# Patient Record
Sex: Male | Born: 1935 | Race: White | Hispanic: No | Marital: Married | Smoking: Current every day smoker
Health system: Southern US, Community
[De-identification: ages and names within clinical notes are randomized; demographics above are authoritative.]

## PROBLEM LIST (undated history)

## (undated) DIAGNOSIS — R339 Retention of urine, unspecified: Secondary | ICD-10-CM

## (undated) DIAGNOSIS — L97929 Non-pressure chronic ulcer of unspecified part of left lower leg with unspecified severity: Secondary | ICD-10-CM

## (undated) DIAGNOSIS — I359 Nonrheumatic aortic valve disorder, unspecified: Secondary | ICD-10-CM

## (undated) DIAGNOSIS — C9110 Chronic lymphocytic leukemia of B-cell type not having achieved remission: Secondary | ICD-10-CM

## (undated) DIAGNOSIS — F17210 Nicotine dependence, cigarettes, uncomplicated: Secondary | ICD-10-CM

## (undated) DIAGNOSIS — I517 Cardiomegaly: Secondary | ICD-10-CM

## (undated) DIAGNOSIS — N400 Enlarged prostate without lower urinary tract symptoms: Secondary | ICD-10-CM

## (undated) DIAGNOSIS — I5030 Unspecified diastolic (congestive) heart failure: Secondary | ICD-10-CM

## (undated) DIAGNOSIS — J449 Chronic obstructive pulmonary disease, unspecified: Secondary | ICD-10-CM

## (undated) DIAGNOSIS — R161 Splenomegaly, not elsewhere classified: Secondary | ICD-10-CM

## (undated) DIAGNOSIS — I2510 Atherosclerotic heart disease of native coronary artery without angina pectoris: Secondary | ICD-10-CM

## (undated) DIAGNOSIS — I700 Atherosclerosis of aorta: Secondary | ICD-10-CM

## (undated) DIAGNOSIS — I739 Peripheral vascular disease, unspecified: Secondary | ICD-10-CM

## (undated) DIAGNOSIS — I4510 Unspecified right bundle-branch block: Secondary | ICD-10-CM

## (undated) DIAGNOSIS — D649 Anemia, unspecified: Secondary | ICD-10-CM

## (undated) HISTORY — DX: Unspecified right bundle-branch block: I45.10

## (undated) HISTORY — DX: Anemia, unspecified: D64.9

## (undated) HISTORY — DX: Unspecified diastolic (congestive) heart failure: I50.30

## (undated) HISTORY — DX: Nonrheumatic aortic valve disorder, unspecified: I35.9

## (undated) HISTORY — DX: Cardiomegaly: I51.7

## (undated) HISTORY — PX: HERNIA REPAIR: SHX51

## (undated) HISTORY — DX: Nicotine dependence, cigarettes, uncomplicated: F17.210

## (undated) HISTORY — DX: Chronic obstructive pulmonary disease, unspecified: J44.9

## (undated) HISTORY — PX: KNEE SURGERY: SHX244

## (undated) HISTORY — DX: Peripheral vascular disease, unspecified: I73.9

## (undated) HISTORY — DX: Non-pressure chronic ulcer of unspecified part of left lower leg with unspecified severity: L97.929

---

## 2020-03-09 ENCOUNTER — Emergency Department
Admission: EM | Admit: 2020-03-09 | Discharge: 2020-03-09 | Disposition: A | Discharge status: Home / Self Care | Payer: Medicare HMO | Attending: Emergency Medicine | Admitting: Emergency Medicine

## 2020-03-09 ENCOUNTER — Other Ambulatory Visit: Payer: Self-pay

## 2020-03-09 ENCOUNTER — Encounter: Payer: Self-pay | Admitting: Emergency Medicine

## 2020-03-09 DIAGNOSIS — F17200 Nicotine dependence, unspecified, uncomplicated: Secondary | ICD-10-CM | POA: Diagnosis not present

## 2020-03-09 DIAGNOSIS — N179 Acute kidney failure, unspecified: Secondary | ICD-10-CM | POA: Insufficient documentation

## 2020-03-09 DIAGNOSIS — N19 Unspecified kidney failure: Secondary | ICD-10-CM

## 2020-03-09 DIAGNOSIS — R338 Other retention of urine: Secondary | ICD-10-CM

## 2020-03-09 DIAGNOSIS — R1033 Periumbilical pain: Secondary | ICD-10-CM | POA: Insufficient documentation

## 2020-03-09 DIAGNOSIS — R339 Retention of urine, unspecified: Secondary | ICD-10-CM | POA: Insufficient documentation

## 2020-03-09 DIAGNOSIS — R102 Pelvic and perineal pain: Secondary | ICD-10-CM

## 2020-03-09 HISTORY — DX: Benign prostatic hyperplasia without lower urinary tract symptoms: N40.0

## 2020-03-09 HISTORY — DX: Retention of urine, unspecified: R33.9

## 2020-03-09 LAB — COMPREHENSIVE METABOLIC PANEL
ALT: 16 U/L (ref 0–44)
AST: 26 U/L (ref 15–41)
Albumin: 4.4 g/dL (ref 3.5–5.0)
Alkaline Phosphatase: 79 U/L (ref 38–126)
Anion gap: 10 (ref 5–15)
BUN: 31 mg/dL — ABNORMAL HIGH (ref 8–23)
CO2: 23 mmol/L (ref 22–32)
Calcium: 9.1 mg/dL (ref 8.9–10.3)
Chloride: 100 mmol/L (ref 98–111)
Creatinine, Ser: 1.93 mg/dL — ABNORMAL HIGH (ref 0.61–1.24)
GFR calc Af Amer: 35 mL/min — ABNORMAL LOW (ref >60)
GFR calc non Af Amer: 30 mL/min — ABNORMAL LOW (ref >60)
Glucose, Bld: 99 mg/dL (ref 70–99)
Potassium: 4.9 mmol/L (ref 3.5–5.1)
Sodium: 133 mmol/L — ABNORMAL LOW (ref 135–145)
Total Bilirubin: 0.7 mg/dL (ref 0.3–1.2)
Total Protein: 7 g/dL (ref 6.5–8.1)

## 2020-03-09 LAB — URINALYSIS, COMPLETE (UACMP) WITH MICROSCOPIC
Bacteria, UA: NONE SEEN
Bilirubin Urine: NEGATIVE
Glucose, UA: NEGATIVE mg/dL
Hgb urine dipstick: NEGATIVE
Ketones, ur: NEGATIVE mg/dL
Nitrite: NEGATIVE
Protein, ur: NEGATIVE mg/dL
Specific Gravity, Urine: 1.008 (ref 1.005–1.030)
Squamous Epithelial / HPF: NONE SEEN (ref 0–5)
pH: 6 (ref 5.0–8.0)

## 2020-03-09 LAB — CBC WITH DIFFERENTIAL/PLATELET
Abs Immature Granulocytes: 0.03 10*3/uL (ref 0.00–0.07)
Basophils Absolute: 0 10*3/uL (ref 0.0–0.1)
Basophils Relative: 0 %
Eosinophils Absolute: 0 10*3/uL (ref 0.0–0.5)
Eosinophils Relative: 0 %
HCT: 36 % — ABNORMAL LOW (ref 39.0–52.0)
Hemoglobin: 12.6 g/dL — ABNORMAL LOW (ref 13.0–17.0)
Immature Granulocytes: 0 %
Lymphocytes Relative: 39 %
Lymphs Abs: 4.4 10*3/uL — ABNORMAL HIGH (ref 0.7–4.0)
MCH: 29.4 pg (ref 26.0–34.0)
MCHC: 35 g/dL (ref 30.0–36.0)
MCV: 83.9 fL (ref 80.0–100.0)
Monocytes Absolute: 1.3 10*3/uL — ABNORMAL HIGH (ref 0.1–1.0)
Monocytes Relative: 11 %
Neutro Abs: 5.5 10*3/uL (ref 1.7–7.7)
Neutrophils Relative %: 50 %
Platelets: 121 10*3/uL — ABNORMAL LOW (ref 150–400)
RBC: 4.29 MIL/uL (ref 4.22–5.81)
RDW: 14.5 % (ref 11.5–15.5)
WBC: 11.3 10*3/uL — ABNORMAL HIGH (ref 4.0–10.5)
nRBC: 0 % (ref 0.0–0.2)

## 2020-03-09 NOTE — ED Notes (Signed)
Leg bag placed by EDT. Patient is alert and oriented. Family member at bedside states he understands use and instructions on how to empty the bag as he has had many in the past. Discharge instructions given and verbalized understanding.

## 2020-03-09 NOTE — Discharge Instructions (Addendum)
You were seen in the ED because of your urinary retention.  We placed an indwelling Foley catheter to relieve this pressure.  Please call Dr. Carlton Adam office to be seen by one of our urologists in the next 1 to 2 weeks.  They will likely remove the Foley catheter. Please continue to take your Bactrim antibiotic, tamsulosin and finasteride.  Finish the whole antibiotic course.  I would also recommend that you be seen by a new primary care physician within the next 1-2 weeks.  Please call [facility] to establish with them. They would likely need to repeat an EKG because yours wasslightly abnormal here.  They will also need to repeat some blood tests related to your kidneys to make sure your numbers improved.  Your creatinine was slightly elevated here today, likely just because of your urinary retention.  It would be good to ensure that this improves.   If you develop any chest pain, heart palpitations, shortness of breath or passing out, please return to the ED. If you develop any fevers, nondraining Foley catheter or worsening abdominal pain, please return to the ED.

## 2020-03-09 NOTE — ED Triage Notes (Signed)
Pt to ED via POV c/o urinary retention. Pt states that this is recurrent issue. Pt was able to go around 0100 but none since then. Pt states that he feels like he needs to pee but is unable to. Pt is in NAD  Pt noted to have irregular HR during triage. EKG obtained and reviewed by Dr. Corky Downs.

## 2020-03-09 NOTE — ED Notes (Signed)
Bladder scan indicates pt is holding over 925mL. Levada Dy RN notified.

## 2020-03-09 NOTE — ED Provider Notes (Signed)
[facility] Emergency Department Provider Note ____________________________________________   First MD Initiated Contact with Patient 03/09/20 1509     (approximate)  I have reviewed the triage vital signs and the nursing notes.  HISTORY  Chief Complaint Urinary Retention   HPI Spencer Buck is a 86 y.o. malewho presents to the ED for evaluation of urinary retention.   Chart review indicates patient has never been to our facility.  Patient presents with his daughter to the ED for evaluation of acute urinary retention. They report he recently moved to the area from [place].  There, he has a urologist and a history of recurrent urinary retention.  He reports having a catheter indwelling in the past multiple times.  He reports currently being on Bactrim for 14 days to treat UTI, as well as being prescribed finasteride and tamsulosin.   They report that during the recent move to the area, patient lost his finasteride and tamsulosin and went multiple days without his medications.  He does report having his Bactrim through all this and has been compliant with his medication.  He reports improving dysuria.  Denies fevers, flank pain, back pain.   He reports onset of inability to urinate for the past 13 hours despite multiple efforts.  He reports increasing discomfort to his lower abdomen.  He reports relief of his pain with indwelling Foley catheter placement prior to my evaluation of the patient.  Pain was up to 10/10 intensity, lower abdomen, aching in nature, nonradiating and he did not take any medications prior to arrival.  Patient has no cardiac history.  Denies ever having heart attack, denies stents in his heart and denies ever seeing a cardiologist.  He denies any recent chest pain, palpitations, shortness of breath, syncope, diaphoresis.   Past Medical History:  Diagnosis Date  . BPH (benign prostatic hyperplasia)   . Urinary retention      There are no problems to display for this patient.   Past Surgical History:  Procedure Laterality Date  . HERNIA REPAIR    . KNEE SURGERY Left     Prior to Admission medications   Not on File    Allergies Patient has no known allergies.  No family history on file.  Social History Social History   Tobacco Use  . Smoking status: Current Every Day Smoker  . Smokeless tobacco: Never Used  Substance Use Topics  . Alcohol use: Yes    Comment: 1/2 beer every night   . Drug use: Never    Review of Systems  Constitutional: No fever/chills Eyes: No visual changes. ENT: No sore throat. Cardiovascular: Denies chest pain. Respiratory: Denies shortness of breath. Gastrointestinal: No abdominal pain.  No nausea, no vomiting.  No diarrhea.  No constipation. Genitourinary: Negative for dysuria.  Positive for acute urinary retention. Musculoskeletal: Negative for back pain. Skin: Negative for rash. Neurological: Negative for headaches, focal weakness or numbness.   ____________________________________________   PHYSICAL EXAM:  VITAL SIGNS: Vitals:   03/09/20 1359 03/09/20 1514  BP: (!) 141/71   Pulse: (!) 25 90  Resp: 16   SpO2: 98% 99%      Constitutional: Alert and oriented. Well appearing and in no acute distress.  Slightly hard of hearing.  Pleasant and conversational full sentences. Eyes: Conjunctivae are normal. PERRL. EOMI. Head: Atraumatic. Nose: No congestion/rhinnorhea. Mouth/Throat: Mucous membranes are moist.  Oropharynx non-erythematous. Neck: No stridor. No cervical spine tenderness to palpation. Cardiovascular: Normal rate, regular rhythm. Grossly normal heart sounds.  Good peripheral circulation. Respiratory: Normal respiratory effort.  No retractions. Lungs CTAB. Gastrointestinal: Soft , nondistended, nontender to palpation. No abdominal bruits. No CVA tenderness. Indwelling Foley catheter in place draining yellow urine. Benign abdomen  throughout. Musculoskeletal: No lower extremity tenderness nor edema.  No joint effusions. No signs of acute trauma. Neurologic:  Normal speech and language. No gross focal neurologic deficits are appreciated. No gait instability noted. Skin:  Skin is warm, dry and intact. No rash noted. Psychiatric: Mood and affect are normal. Speech and behavior are normal.  ____________________________________________   LABS (all labs ordered are listed, but only abnormal results are displayed)  Labs Reviewed  URINALYSIS, COMPLETE (UACMP) WITH MICROSCOPIC - Abnormal; Notable for the following components:      Result Value   Color, Urine YELLOW (*)    APPearance HAZY (*)    Leukocytes,Ua MODERATE (*)    All other components within normal limits  CBC WITH DIFFERENTIAL/PLATELET - Abnormal; Notable for the following components:   WBC 11.3 (*)    Hemoglobin 12.6 (*)    HCT 36.0 (*)    Platelets 121 (*)    Lymphs Abs 4.4 (*)    Monocytes Absolute 1.3 (*)    All other components within normal limits  COMPREHENSIVE METABOLIC PANEL - Abnormal; Notable for the following components:   Sodium 133 (*)    BUN 31 (*)    Creatinine, Ser 1.93 (*)    GFR calc non Af Amer 30 (*)    GFR calc Af Amer 35 (*)    All other components within normal limits  URINE CULTURE   ____________________________________________  12 Lead EKG  Sinus rhythm with sinus arrhythmia.  Rate of 85 bpm.  Normal axis.  Evidence of incomplete RBBB, but intervals otherwise normal.  T wave inversions anteriorly in V1-V3.  No ST elevations or evidence of acute ischemia.  No comparison in our system. _________________________   PROCEDURES and INTERVENTIONS  Foley catheter placement by RN ____________________________________________   MDM / ED COURSE  86 year old man with history of BPH and urinary retention presents to the ED with acute retention requiring indwelling Foley catheter placement, and amenable to outpatient management.   Normal vital signs on room air.  Exam after Foley catheter placement is reassuring without evidence of acute pathology.  His abdomen is benign and he is draining yellow urine appropriately.  He otherwise looks well and has no evidence of pathology.  Blood work, without previous comparison, shows mild prerenal AKI likely due to his acute urinary retention and associated hydronephrosis, that is now cleared.  Urine with moderate leukocytes, but patient is currently on Bactrim for UTI.  He has no evidence of pyelonephritis or failure of treatment.  Urine sent for culture and will provide information to follow-up with urology within our system, since patient is new to the area.  Further provided information for follow-up with new PCP to ensure his renal function improves and to get repeat EKG.  His EKG has no evidence of ischemia or ACS, and he has no symptoms of such, but does have a sinus arrhythmia with T wave inversions anteriorly without a comparison.  Without his symptoms and without clear pathology on his EKG, no indications for further cardiac testing in the ED.  We discussed return precautions for the ED with daughter and patient.  We discussed outpatient management.  Patient medically stable for discharge home.  ____________________________________________   FINAL CLINICAL IMPRESSION(S) / ED DIAGNOSES  Final diagnoses:  Acute urinary retention  Acute prerenal azotemia  Suprapubic abdominal pain  AKI (acute kidney injury) [facility])     ED Discharge Orders    None       Spencer Buck   Note:  This document was prepared using Dragon voice recognition software and may include unintentional dictation errors.   Vladimir Crofts, MD 03/09/20 [phone]

## 2020-03-13 LAB — URINE CULTURE: Culture: >=100000 — AB

## 2020-03-14 NOTE — Progress Notes (Signed)
ED Antimicrobial Stewardship Positive Culture Follow Up   Spencer Buck is an 86 y.o. male who presented to [facility] on 03/09/2020 with a chief complaint of urinary retention. This pharmacist spoke to patient and he reports no UTI symptoms. Per patient, he continues to have the foley catheter in place and is almost done with his course of Bactrime. D/w Dr. Tamala Julian and it was noted that the patient does not need to start linezolid in the setting of no symptoms. Per patient's daughter, he has an appointment with a Urologist on October 8th.   Chief Complaint  Patient presents with  . Urinary Retention    Recent Results (from the past 720 hour(s))  Urine culture     Status: Abnormal   Collection Time: 03/09/20  2:33 PM   Specimen: Urine, Random  Result Value Ref Range Status   Specimen Description   Final    URINE, RANDOM Performed at [facility], [address], [place], [place] [address]    Special Requests   Final    NONE Performed at [facility], [address] [place],  [address]    Culture (A)  Final    >=100,000 COLONIES/mL VANCOMYCIN RESISTANT ENTEROCOCCUS ISOLATED   Report Status 03/13/2020 FINAL  Final   Organism ID, Bacteria VANCOMYCIN RESISTANT ENTEROCOCCUS ISOLATED (A)  Final      Susceptibility   Vancomycin resistant enterococcus isolated - MIC*    AMPICILLIN >=32 RESISTANT Resistant     NITROFURANTOIN 128 RESISTANT Resistant     VANCOMYCIN >=32 RESISTANT Resistant     LINEZOLID 2 SENSITIVE Sensitive     * >=100,000 COLONIES/mL VANCOMYCIN RESISTANT ENTEROCOCCUS ISOLATED    [x]  Treated with Bactrim, organism resistant to/is not covered by prescribed antimicrobial []  Patient discharged originally without antimicrobial agent and treatment is now indicated  New antibiotic prescription: None   ED Provider: Dr. Vladimir Crofts   Rowland Lathe ,[place] Pharmacist  03/14/2020, 6:13 PM

## 2020-03-14 NOTE — Progress Notes (Signed)
Pharmacy tried calling patient's daughter but was unable to reach her. Voicemail is full and unable to leave a VM.   Spencer Buck, PharmD Clinical Pharmacist

## 2020-03-21 NOTE — Progress Notes (Signed)
° °  03/22/2020 12:27 PM   Spencer Buck [birth year] [id number]  Referring provider: No referring provider defined for this encounter. No chief complaint on file.   HPI: Spencer Buck is a 86 y.o. male who presents today for evaluation and management of urinary retention.   -Patient recently moved to the area from [place].    -January 2021 began to have bothersome lower urinary tract symptoms and recurrent UTIs -Has been seen by urology at [facility] -Apparently 2-3 episodes of urinary retention since January 2021 -seen in the ED at [facility] on 03/09/2020 for urinary retention. He reported being on Bactrim for 14 days to treat UTI, as well as being prescribed finasteride and tamsulosin. During the move to [place] the patient reportedly lost his finasteride and tamsulosin and went multiple days without his medications.  -Foley catheter was placed at that visit -Urine culture grew greater than 100,000 colonies vancomycin-resistant Enterococcus however he was having no UTI symptoms -Presently no complaints and tolerating catheter well  PMH: Past Medical History:  Diagnosis Date   BPH (benign prostatic hyperplasia)    Urinary retention     Surgical History: Past Surgical History:  Procedure Laterality Date   HERNIA REPAIR     KNEE SURGERY Left     Home Medications:  Allergies as of 03/22/2020   No Known Allergies     Medication List    as of March 21, 2020 12:27 PM   You have not been prescribed any medications.     Allergies: No Known Allergies  Family History: No family history on file.  Social History:  reports that he has been smoking. He has never used smokeless tobacco. He reports current alcohol use. He reports that he does not use drugs.   Physical Exam: There were no vitals taken for this visit.  Constitutional:  Alert and oriented, No acute distress. HEENT: [place] AT, moist mucus membranes.  Trachea midline, no masses. Cardiovascular: No  clubbing, cyanosis, or edema. Respiratory: Normal respiratory effort, no increased work of breathing. GI: Abdomen is soft, nontender, nondistended, no abdominal masses GU: Prostate with increased firmness; volume 80+ cc Skin: No rashes, bruises or suspicious lesions. Neurologic: Grossly intact, no focal deficits, moving all 4 extremities. Psychiatric: Normal mood and affect.  Laboratory Data:  Lab Results  Component Value Date   CREATININE 1.93 (H) 03/09/2020    Assessment & Plan:    1.  BPH with LUTS  Has restarted tamsulosin/finasteride  Record request for his prior urologic records  2.  Recurrent urinary retention  We will leave his catheter indwelling over the weekend.  He was instructed on how to remove the catheter and will do so Monday morning and return here late Monday morning/early afternoon for bladder scan for PVR  3.  Recurrent UTI  Presently asymptomatic and most recent Enterococcus culture may represent colonization  Repeat UA/culture when he returns next week  [facility] [address] [place], [place] [address] [phone]  I, Selena Batten, am acting as a scribe for Dr. Nicki Reaper C. Nayib Remer,  I have reviewed the above documentation for accuracy and completeness, and I agree with the above.   Abbie Sons, MD

## 2020-03-22 ENCOUNTER — Other Ambulatory Visit: Payer: Self-pay

## 2020-03-22 ENCOUNTER — Encounter: Payer: Self-pay | Admitting: Urology

## 2020-03-22 ENCOUNTER — Ambulatory Visit: Payer: Self-pay | Admitting: Physician Assistant

## 2020-03-22 ENCOUNTER — Ambulatory Visit: Payer: Medicare PPO | Admitting: Urology

## 2020-03-22 VITALS — BP 100/64 | HR 76 | Ht 72.0 in | Wt 153.6 lb

## 2020-03-22 DIAGNOSIS — R339 Retention of urine, unspecified: Secondary | ICD-10-CM

## 2020-03-22 DIAGNOSIS — N401 Enlarged prostate with lower urinary tract symptoms: Secondary | ICD-10-CM | POA: Diagnosis not present

## 2020-03-22 DIAGNOSIS — N390 Urinary tract infection, site not specified: Secondary | ICD-10-CM

## 2020-03-25 ENCOUNTER — Other Ambulatory Visit: Payer: Self-pay

## 2020-03-25 ENCOUNTER — Ambulatory Visit (INDEPENDENT_AMBULATORY_CARE_PROVIDER_SITE_OTHER): Payer: Medicare PPO | Admitting: Physician Assistant

## 2020-03-25 VITALS — BP 96/56 | HR 73

## 2020-03-25 DIAGNOSIS — R339 Retention of urine, unspecified: Secondary | ICD-10-CM

## 2020-03-25 LAB — BLADDER SCAN AMB NON-IMAGING

## 2020-03-26 LAB — URINALYSIS, COMPLETE
Bilirubin, UA: NEGATIVE
Glucose, UA: NEGATIVE
Ketones, UA: NEGATIVE
Leukocytes,UA: NEGATIVE
Nitrite, UA: NEGATIVE
Protein,UA: NEGATIVE
Specific Gravity, UA: 1.015 (ref 1.005–1.030)
Urobilinogen, Ur: 0.2 mg/dL (ref 0.2–1.0)
pH, UA: 6 (ref 5.0–7.5)

## 2020-03-26 LAB — MICROSCOPIC EXAMINATION: Bacteria, UA: NONE SEEN

## 2020-03-28 NOTE — Progress Notes (Signed)
03/25/2020 5:20 PM   Spencer Buck 04-03-1931 [id number]  CC: Chief Complaint  Patient presents with  . Urinary Retention    PM bladder scan    HPI: Spencer Buck is a 86 y.o. male with a history of BPH with LUTS on tamsulosin and finasteride, recurrent UTI versus possible VRE colonization, and urinary retention who presents today for afternoon PVR after at home catheter removal this morning.  Patient developed urinary retention recently in the setting of holding his prosthetic agents.  These have been restarted.  Today he reports feeling well.  No acute concerns.  He has been urinating.  In-office UA today positive for trace-intact blood; urine microscopy with 6-10 WBCs/HPF. PVR 25mL.  PMH: Past Medical History:  Diagnosis Date  . BPH (benign prostatic hyperplasia)   . Urinary retention     Surgical History: Past Surgical History:  Procedure Laterality Date  . HERNIA REPAIR    . KNEE SURGERY Left     Home Medications:  Allergies as of 03/25/2020   No Known Allergies     Medication List       Accurate as of March 25, 2020 11:59 PM. If you have any questions, ask your nurse or doctor.        finasteride 5 MG tablet Commonly known as: PROSCAR Take 5 mg by mouth daily.   sulfamethoxazole-trimethoprim 800-160 MG tablet Commonly known as: BACTRIM DS Take 1 tablet by mouth 2 (two) times daily.   tamsulosin 0.4 MG Caps capsule Commonly known as: FLOMAX Take 0.8 mg by mouth daily.       Allergies:  No Known Allergies  Family History: No family history on file.  Social History:   reports that he has been smoking. He has never used smokeless tobacco. He reports current alcohol use. He reports that he does not use drugs.  Physical Exam: BP (!) 96/56   Pulse 73   Constitutional:  Alert and oriented, no acute distress, nontoxic appearing HEENT: [place], AT Cardiovascular: No clubbing, cyanosis, or edema Respiratory: Normal respiratory effort, no  increased work of breathing Skin: No rashes, bruises or suspicious lesions Neurologic: Grossly intact, no focal deficits, moving all 4 extremities Psychiatric: Normal mood and affect  Laboratory Data: Results for orders placed or performed in visit on 03/25/20  CULTURE, URINE COMPREHENSIVE   Specimen: Urine   UR  Result Value Ref Range   Urine Culture, Comprehensive Preliminary report (A)    Organism ID, Bacteria Enterococcus faecium (A)    Organism ID, Bacteria Comment   Microscopic Examination   Urine  Result Value Ref Range   WBC, UA 6-10 (A) 0 - 5 /hpf   RBC 0-2 0 - 2 /hpf   Epithelial Cells (non renal) 0-10 0 - 10 /hpf   Casts Present (A) None seen /lpf   Cast Type Hyaline casts N/A   Bacteria, UA None seen None seen/Few  Urinalysis, Complete  Result Value Ref Range   Specific Gravity, UA 1.015 1.005 - 1.030   pH, UA 6.0 5.0 - 7.5   Color, UA Yellow Yellow   Appearance Ur Clear Clear   Leukocytes,UA Negative Negative   Protein,UA Negative Negative/Trace   Glucose, UA Negative Negative   Ketones, UA Negative Negative   RBC, UA Trace (A) Negative   Bilirubin, UA Negative Negative   Urobilinogen, Ur 0.2 0.2 - 1.0 mg/dL   Nitrite, UA Negative Negative   Microscopic Examination See below:   BLADDER SCAN AMB NON-IMAGING  Result Value Ref  Range   Scan Result 54ml    Assessment & Plan:   1. Urinary retention PVR WNL today, successful voiding trial after having restarted tamsulosin and finasteride.  UA obtained today, will send for culture for further evaluation. - Urinalysis, Complete - CULTURE, URINE COMPREHENSIVE - BLADDER SCAN AMB NON-IMAGING  Return in about 6 months (around 09/23/2020) for Symptom recheck with PVR, h/o retention.  Debroah Loop, PA-C  [facility] [address] [place], [place] [address] [phone]

## 2020-03-31 LAB — CULTURE, URINE COMPREHENSIVE

## 2020-08-01 ENCOUNTER — Other Ambulatory Visit: Payer: Self-pay

## 2020-08-01 ENCOUNTER — Encounter: Payer: Medicare PPO | Attending: Physician Assistant | Admitting: Physician Assistant

## 2020-08-01 DIAGNOSIS — I83891 Varicose veins of right lower extremities with other complications: Secondary | ICD-10-CM | POA: Insufficient documentation

## 2020-08-01 DIAGNOSIS — I872 Venous insufficiency (chronic) (peripheral): Secondary | ICD-10-CM | POA: Insufficient documentation

## 2020-08-01 DIAGNOSIS — I878 Other specified disorders of veins: Secondary | ICD-10-CM | POA: Insufficient documentation

## 2020-08-01 DIAGNOSIS — F17200 Nicotine dependence, unspecified, uncomplicated: Secondary | ICD-10-CM | POA: Diagnosis not present

## 2020-08-01 NOTE — Progress Notes (Addendum)
ROYALTY, DOMAGALA ([id number]) Visit Report for 08/01/2020 Allergy List Details Patient Name: Spencer Buck, Spencer Buck Date of Service: 08/01/2020 8:30 AM Medical Record Number: [id number] Patient Account Number: [id number] Date of Birth/Sex: 1931/01/18 (87 y.o. M) Treating RN: Carlene Coria Primary Care Treson Laura: Eda Paschal Other Clinician: Referring Yvonnie Schinke: Eda Paschal Treating Taralynn Quiett/Extender: Skipper Cliche in Treatment: 0 Allergies Active Allergies No Known Allergies Type: Allergen Allergy Notes Electronic Signature(s) Signed: 08/01/2020 9:40:15 AM By: Carlene Coria RN Entered By: Carlene Coria on 08/01/2020 09:07:28 Spencer Buck ([id number]) -------------------------------------------------------------------------------- Arrival Information Details Patient Name: Spencer Buck Date of Service: 08/01/2020 8:30 AM Medical Record Number: [id number] Patient Account Number: [id number] Date of Birth/Sex: January 14, 1931 (87 y.o. M) Treating RN: Carlene Coria Primary Care Aslan Himes: Eda Paschal Other Clinician: Referring Coulson Wehner: Eda Paschal Treating Kalim Kissel/Extender: Skipper Cliche in Treatment: 0 Visit Information Patient Arrived: Ambulatory Arrival Time: 08:57 Accompanied By: daughter Transfer Assistance: None Patient Identification Verified: Yes Secondary Verification Process Completed: Yes Patient Requires Transmission-Based Precautions: No Patient Has Alerts: No Electronic Signature(s) Signed: 08/01/2020 9:40:15 AM By: Carlene Coria RN Entered By: Carlene Coria on 08/01/2020 09:00:25 Spencer Buck ([id number]) -------------------------------------------------------------------------------- Clinic Level of Care Assessment Details Patient Name: Spencer Buck Date of Service: 08/01/2020 8:30 AM Medical Record Number: [id number] Patient Account Number: [id number] Date of Birth/Sex: 1930/07/28 (87 y.o. M) Treating RN: Dolan Amen Primary Care Satish Hammers:  Eda Paschal Other Clinician: Referring Idania Desouza: Eda Paschal Treating Kysa Calais/Extender: Skipper Cliche in Treatment: 0 Clinic Level of Care Assessment Items TOOL 4 Quantity Score X - Use when only an EandM is performed on FOLLOW-UP visit 1 0 ASSESSMENTS - Nursing Assessment / Reassessment X - Reassessment of Co-morbidities (includes updates in patient status) 1 10 X- 1 5 Reassessment of Adherence to Treatment Plan ASSESSMENTS - Wound and Skin Assessment / Reassessment []  - Simple Wound Assessment / Reassessment - one wound 0 []  - 0 Complex Wound Assessment / Reassessment - multiple wounds []  - 0 Dermatologic / Skin Assessment (not related to wound area) ASSESSMENTS - Focused Assessment X - Circumferential Edema Measurements - multi extremities 1 5 []  - 0 Nutritional Assessment / Counseling / Intervention X- 1 5 Lower Extremity Assessment (monofilament, tuning fork, pulses) []  - 0 Peripheral Arterial Disease Assessment (using hand held doppler) ASSESSMENTS - Ostomy and/or Continence Assessment and Care []  - Incontinence Assessment and Management 0 []  - 0 Ostomy Care Assessment and Management (repouching, etc.) PROCESS - Coordination of Care X - Simple Patient / Family Education for ongoing care 1 15 []  - 0 Complex (extensive) Patient / Family Education for ongoing care []  - 0 Staff obtains Programmer, systems, Records, Test Results / Process Orders []  - 0 Staff telephones HHA, Nursing Homes / Clarify orders / etc []  - 0 Routine Transfer to another Facility (non-emergent condition) []  - 0 Routine Hospital Admission (non-emergent condition) []  - 0 New Admissions / Biomedical engineer / Ordering NPWT, Apligraf, etc. []  - 0 Emergency Hospital Admission (emergent condition) X- 1 10 Simple Discharge Coordination []  - 0 Complex (extensive) Discharge Coordination PROCESS - Special Needs []  - Pediatric / Minor Patient Management 0 []  - 0 Isolation Patient  Management []  - 0 Hearing / Language / Visual special needs []  - 0 Assessment of Community assistance (transportation, D/C planning, etc.) []  - 0 Additional assistance / Altered mentation []  - 0 Support Surface(s) Assessment (bed, cushion, seat, etc.) INTERVENTIONS - Wound Cleansing / Measurement Richens, Omarii ([id number]) []  - 0 Simple Wound Cleansing - one wound []  - 0 Complex Wound Cleansing - multiple  wounds []  - 0 Wound Imaging (photographs - any number of wounds) []  - 0 Wound Tracing (instead of photographs) []  - 0 Simple Wound Measurement - one wound []  - 0 Complex Wound Measurement - multiple wounds INTERVENTIONS - Wound Dressings []  - Small Wound Dressing one or multiple wounds 0 []  - 0 Medium Wound Dressing one or multiple wounds []  - 0 Large Wound Dressing one or multiple wounds []  - 0 Application of Medications - topical []  - 0 Application of Medications - injection INTERVENTIONS - Miscellaneous []  - External ear exam 0 []  - 0 Specimen Collection (cultures, biopsies, blood, body fluids, etc.) []  - 0 Specimen(s) / Culture(s) sent or taken to Lab for analysis []  - 0 Patient Transfer (multiple staff / Civil Service fast streamer / Similar devices) []  - 0 Simple Staple / Suture removal (25 or less) []  - 0 Complex Staple / Suture removal (26 or more) []  - 0 Hypo / Hyperglycemic Management (close monitor of Blood Glucose) []  - 0 Ankle / Brachial Index (ABI) - do not check if billed separately X- 1 5 Vital Signs Has the patient been seen at the hospital within the last three years: Yes Total Score: 55 Level Of Care: New/Established - Level 2 Electronic Signature(s) Signed: 08/01/2020 12:41:55 PM By: Georges Mouse, Minus Breeding RN Entered By: Georges Mouse, Minus Breeding on 08/01/2020 09:28:26 Spencer Buck ([id number]) -------------------------------------------------------------------------------- Encounter Discharge Information Details Patient Name: Spencer Buck Date  of Service: 08/01/2020 8:30 AM Medical Record Number: [id number] Patient Account Number: [id number] Date of Birth/Sex: [birth year] (87 y.o. M) Treating RN: Dolan Amen Primary Care Teron Blais: Eda Paschal Other Clinician: Referring Abhay Godbolt: Eda Paschal Treating Audrick Lamoureaux/Extender: Skipper Cliche in Treatment: 0 Encounter Discharge Information Items Discharge Condition: Stable Ambulatory Status: Ambulatory Discharge Destination: Home Transportation: Private Auto Accompanied By: daughter Schedule Follow-up Appointment: No Clinical Summary of Care: Electronic Signature(s) Signed: 08/01/2020 12:41:55 PM By: Georges Mouse, Minus Breeding RN Entered By: Georges Mouse, Minus Breeding on 08/01/2020 09:38:02 Spencer Buck ([id number]) -------------------------------------------------------------------------------- Lower Extremity Assessment Details Patient Name: Spencer Buck Date of Service: 08/01/2020 8:30 AM Medical Record Number: [id number] Patient Account Number: [id number] Date of Birth/Sex: 1931/01/29 (87 y.o. M) Treating RN: Carlene Coria Primary Care Laquia Rosano: Eda Paschal Other Clinician: Referring Darcy Barbara: Eda Paschal Treating Lynk Marti/Extender: Skipper Cliche in Treatment: 0 Edema Assessment Assessed: [Left: No] [Right: No] Edema: [Left: Yes] [Right: Yes] Calf Left: Right: Point of Measurement: 43 cm From Medial Instep 36 cm 38 cm Ankle Left: Right: Point of Measurement: 11 cm From Medial Instep 24.5 cm 24 cm Knee To Floor Left: Right: From Medial Instep 52 cm 53 cm Vascular Assessment Pulses: Dorsalis Pedis Palpable: [Left:Yes] [Right:Yes] Electronic Signature(s) Signed: 08/01/2020 9:40:15 AM By: Carlene Coria RN Signed: 08/01/2020 12:41:55 PM By: Georges Mouse, Minus Breeding RN Entered By: Georges Mouse, Minus Breeding on 08/01/2020 09:34:21 Spencer Buck ([id number]) -------------------------------------------------------------------------------- Multi Wound Chart  Details Patient Name: Spencer Buck Date of Service: 08/01/2020 8:30 AM Medical Record Number: [id number] Patient Account Number: [id number] Date of Birth/Sex: 1931/03/15 (87 y.o. M) Treating RN: Dolan Amen Primary Care Talibah Colasurdo: Eda Paschal Other Clinician: Referring Tyriana Helmkamp: Eda Paschal Treating Keaisha Sublette/Extender: Skipper Cliche in Treatment: 0 Vital Signs Height(in): 72 Pulse(bpm): 92 Weight(lbs): 154 Blood Pressure(mmHg): 115/65 Body Mass Index(BMI): 21 Temperature(F): 97.9 Respiratory Rate(breaths/min): 18 Wound Assessments Treatment Notes Electronic Signature(s) Signed: 08/01/2020 12:41:55 PM By: Georges Mouse, Minus Breeding RN Entered By: Georges Mouse, Minus Breeding on 08/01/2020 09:24:28 Spencer Buck ([id number]) -------------------------------------------------------------------------------- Multi-Disciplinary Care Plan Details Patient Name: Spencer Buck Date of Service: 08/01/2020 8:30 AM Medical Record Number:  [id number] Patient Account Number: [id number] Date of Birth/Sex: November 16, 1930 (87 y.o. M) Treating RN: Dolan Amen Primary Care Toretto Tingler: Eda Paschal Other Clinician: Referring Zamarian Scarano: Eda Paschal Treating Aerin Delany/Extender: Skipper Cliche in Treatment: 0 Active Inactive Electronic Signature(s) Signed: 08/07/2020 4:26:01 PM By: Charlett Nose RN Previous Signature: 08/01/2020 12:41:55 PM Version By: Georges Mouse, Minus Breeding RN Entered By: Georges Mouse, Minus Breeding on 08/07/2020 16:26:01 Spencer Buck ([id number]) -------------------------------------------------------------------------------- Pain Assessment Details Patient Name: Spencer Buck Date of Service: 08/01/2020 8:30 AM Medical Record Number: [id number] Patient Account Number: [id number] Date of Birth/Sex: [birth year] (87 y.o. M) Treating RN: Carlene Coria Primary Care Jerney Baksh: Eda Paschal Other Clinician: Referring Joel Cowin: Eda Paschal Treating  Kendricks Reap/Extender: Skipper Cliche in Treatment: 0 Active Problems Location of Pain Severity and Description of Pain Patient Has Paino No Site Locations Pain Management and Medication Current Pain Management: Electronic Signature(s) Signed: 08/01/2020 9:40:15 AM By: Carlene Coria RN Entered By: Carlene Coria on 08/01/2020 09:00:53 Spencer Buck ([id number]) -------------------------------------------------------------------------------- Patient/Caregiver Education Details Patient Name: Spencer Buck Date of Service: 08/01/2020 8:30 AM Medical Record Number: [id number] Patient Account Number: [id number] Date of Birth/Gender: 07/23/1930 (87 y.o. M) Treating RN: Dolan Amen Primary Care Physician: Eda Paschal Other Clinician: Referring Physician: Eda Paschal Treating Physician/Extender: Skipper Cliche in Treatment: 0 Education Assessment Education Provided To: Patient Education Topics Provided Notes Educated on leg elevation, wearing compression socks and following up with vascular Electronic Signature(s) Signed: 08/01/2020 12:41:55 PM By: Georges Mouse, Minus Breeding RN Entered By: Georges Mouse, Minus Breeding on 08/01/2020 09:29:01 Spencer Buck ([id number]) -------------------------------------------------------------------------------- Vitals Details Patient Name: Spencer Buck Date of Service: 08/01/2020 8:30 AM Medical Record Number: [id number] Patient Account Number: [id number] Date of Birth/Sex: [birth year] (87 y.o. M) Treating RN: Carlene Coria Primary Care Keiran Sias: Eda Paschal Other Clinician: Referring Jaslin Novitski: Eda Paschal Treating Anurag Scarfo/Extender: Skipper Cliche in Treatment: 0 Vital Signs Time Taken: 09:00 Temperature (F): 97.9 Height (in): 72 Pulse (bpm): 92 Source: Stated Respiratory Rate (breaths/min): 18 Weight (lbs): 154 Blood Pressure (mmHg): 115/65 Source: Stated Reference Range: 80 - 120 mg / dl Body Mass Index (BMI):  20.9 Electronic Signature(s) Signed: 08/01/2020 9:40:15 AM By: Carlene Coria RN Entered By: Carlene Coria on 08/01/2020 09:01:23

## 2020-08-01 NOTE — Progress Notes (Signed)
Spencer, Buck ([id number]) Visit Report for 08/01/2020 Chief Complaint Document Details Patient Name: Spencer Buck, Spencer Buck Date of Service: 08/01/2020 8:30 AM Medical Record Number: [id number] Patient Account Number: [id number] Date of Birth/Sex: 1931/03/14 (87 y.o. M) Treating RN: Dolan Amen Primary Care Provider: Eda Paschal Other Clinician: Referring Provider: Eda Paschal Treating Provider/Extender: Skipper Cliche in Treatment: 0 Information Obtained from: Patient Chief Complaint Bilateral LE Venous Stasis Electronic Signature(s) Signed: 08/01/2020 9:21:30 AM By: Worthy Keeler PA-C Entered By: Worthy Keeler on 08/01/2020 09:21:30 Spencer Buck ([id number]) -------------------------------------------------------------------------------- HPI Details Patient Name: Spencer Buck Date of Service: 08/01/2020 8:30 AM Medical Record Number: [id number] Patient Account Number: [id number] Date of Birth/Sex: 1930-06-22 (87 y.o. M) Treating RN: Dolan Amen Primary Care Provider: Eda Paschal Other Clinician: Referring Provider: Eda Paschal Treating Provider/Extender: Skipper Cliche in Treatment: 0 History of Present Illness HPI Description: 08/01/2020 upon evaluation today patient presents for initial inspection here in the clinic concerning issues he has been having with his lower extremities. He did have some open blisters/wounds on the lower extremities but fortunately this seems to be doing significantly better. There does not appear to be any signs of infection and in fact he is doing quite well when it comes to his legs currently. From a wound perspective. With that being said he does have varicose veins as well as edema noted at this point. Fortunately there is no signs of active infection at this time. He did have a test at his primary care provider's office this was a TM flow report and it showed that he did have noncompressible readings there.  Nonetheless he has not been referred to vascular yet I do think he needs to be for venous evaluation and likely reflux study. Hopefully they can help more with managing his varicose veins I do think he needs compression for that I discussed getting that done today. Electronic Signature(s) Signed: 08/01/2020 9:42:07 AM By: Worthy Keeler PA-C Entered By: Worthy Keeler on 08/01/2020 09:42:07 ELAND, LAMANTIA ([id number]) -------------------------------------------------------------------------------- Physical Exam Details Patient Name: Spencer Buck Date of Service: 08/01/2020 8:30 AM Medical Record Number: [id number] Patient Account Number: [id number] Date of Birth/Sex: 1930/10/27 (87 y.o. M) Treating RN: Dolan Amen Primary Care Provider: Eda Paschal Other Clinician: Referring Provider: Eda Paschal Treating Provider/Extender: Skipper Cliche in Treatment: 0 Constitutional sitting or standing blood pressure is within target range for patient.. pulse regular and within target range for patient.Marland Kitchen respirations regular, non- labored and within target range for patient.Marland Kitchen temperature within target range for patient.. Well-nourished and well-hydrated in no acute distress. Eyes conjunctiva clear no eyelid edema noted. pupils equal round and reactive to light and accommodation. Ears, Nose, Mouth, and Throat no gross abnormality of ear auricles or external auditory canals. normal hearing noted during conversation. mucus membranes moist. Respiratory normal breathing without difficulty. Cardiovascular 2+ dorsalis pedis/posterior tibialis pulses. no clubbing, cyanosis, significant edema, <3 sec cap refill. Musculoskeletal normal gait and posture. no significant deformity or arthritic changes, no loss or range of motion, no clubbing. Psychiatric this patient is able to make decisions and demonstrates good insight into disease process. Alert and Oriented x 3. pleasant and  cooperative. Notes Upon inspection patient's wound bed actually showed signs again of complete epithelization there is no open wounds at this time which is great news and overall very pleased with where things stand. Fortunately there is no sign of active infection and overall I think that he is doing quite well. His blood pressure is also up compared at  the primary care provider's office likely due to the fact that they have been making him drink more water especially and he is much better hydrated according to what his daughter tells me. Overall I think that he is doing quite well in that regard. Electronic Signature(s) Signed: 08/01/2020 9:42:42 AM By: Worthy Keeler PA-C Entered By: Worthy Keeler on 08/01/2020 09:42:42 Spencer Buck ([id number]) -------------------------------------------------------------------------------- Physician Orders Details Patient Name: Spencer Buck Date of Service: 08/01/2020 8:30 AM Medical Record Number: [id number] Patient Account Number: [id number] Date of Birth/Sex: 1931-03-19 (87 y.o. M) Treating RN: Dolan Amen Primary Care Provider: Eda Paschal Other Clinician: Referring Provider: Eda Paschal Treating Provider/Extender: Skipper Cliche in Treatment: 0 Verbal / Phone Orders: No Diagnosis Coding ICD-10 Coding Code Description I87.2 Venous insufficiency (chronic) (peripheral) J44.9 Chronic obstructive pulmonary disease, unspecified Discharge From [place] Only - no open areas, referred to vascular o Wear compression garments daily. Put garments on first thing when you wake up and remove them before bed. o Moisturize legs daily after removing compression garments. o Elevate, Exercise Daily and Avoid Standing for Long Periods of Time. Consults o Vascular - Varicose veins Patient Medications Allergies: No Known Allergies Notifications Medication Indication Start End triamcinolone acetonide  08/01/2020 DOSE 0 - topical 0.1 % ointment - ointment topical applied in a thin film mixed half and half with Eucerin at bedtime daily x 30 days Electronic Signature(s) Signed: 08/01/2020 9:45:24 AM By: Worthy Keeler PA-C Entered By: Worthy Keeler on 08/01/2020 09:45:24 Spencer Buck ([id number]) -------------------------------------------------------------------------------- Problem List Details Patient Name: Spencer Buck Date of Service: 08/01/2020 8:30 AM Medical Record Number: [id number] Patient Account Number: [id number] Date of Birth/Sex: 17-Oct-1930 (87 y.o. 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 10/30/6158 No Yes I83.892 Varicose veins of left lower extremity with other complications 7/37/1062 No Yes J44.9 Chronic obstructive pulmonary disease, unspecified 08/01/2020 No Yes Inactive Problems Resolved Problems Electronic Signature(s) Signed: 08/01/2020 9:41:06 AM By: Worthy Keeler PA-C Previous Signature: 08/01/2020 9:21:12 AM Version By: Worthy Keeler PA-C Entered By: Worthy Keeler on 08/01/2020 09:41:06 Spencer Buck ([id number]) -------------------------------------------------------------------------------- Progress Note Details Patient Name: Spencer Buck Date of Service: 08/01/2020 8:30 AM Medical Record Number: [id number] Patient Account Number: [id number] Date of Birth/Sex: February 13, 1931 (87 y.o. M) Treating RN: Dolan Amen Primary Care Provider: Eda Paschal Other Clinician: Referring Provider: Eda Paschal Treating Provider/Extender: Skipper Cliche in Treatment: 0 Subjective Chief  Complaint Information obtained from Patient Bilateral LE Venous Stasis History of Present Illness (HPI) 08/01/2020 upon evaluation today patient presents for initial inspection here in the clinic concerning issues he has been having with his lower extremities. He did have some open blisters/wounds on the lower extremities but fortunately this seems to be doing significantly better. There does not appear to be any signs of infection and in fact he is doing quite well when it comes to his legs currently. From a wound perspective. With that being said he does have varicose veins as well as edema noted at this point. Fortunately there is no signs of active infection at this time. He did have a test at his primary care provider's office this was a TM flow report and it showed that he did have noncompressible readings  there. Nonetheless he has not been referred to vascular yet I do think he needs to be for venous evaluation and likely reflux study. Hopefully they can help more with managing his varicose veins I do think he needs compression for that I discussed getting that done today. Patient History Information obtained from Patient. Allergies No Known Allergies Social History Current every day smoker, Marital Status - Married, Alcohol Use - Moderate, Drug Use - No History, Caffeine Use - Daily. Medical History Eyes Denies history of Cataracts, Glaucoma, Optic Neuritis Ear/Nose/Mouth/Throat Denies history of Chronic sinus problems/congestion, Middle ear problems Hematologic/Lymphatic Denies history of Anemia, Hemophilia, Human Immunodeficiency Virus, Lymphedema, Sickle Cell Disease Respiratory Patient has history of Chronic Obstructive Pulmonary Disease (COPD) Denies history of Aspiration, Asthma, Pneumothorax, Sleep Apnea, Tuberculosis Cardiovascular Denies history of Angina, Arrhythmia, Congestive Heart Failure, Coronary Artery Disease, Deep Vein Thrombosis, Hypertension,  Hypotension, Myocardial Infarction, Peripheral Arterial Disease, Peripheral Venous Disease, Phlebitis, Vasculitis Gastrointestinal Denies history of Cirrhosis , Colitis, Crohn s, Hepatitis A, Hepatitis B, Hepatitis C Endocrine Denies history of Type I Diabetes, Type II Diabetes Genitourinary Denies history of End Stage Renal Disease Immunological Denies history of Lupus Erythematosus, Raynaud s, Scleroderma Integumentary (Skin) Denies history of History of Burn, History of pressure wounds Musculoskeletal Denies history of Gout, Rheumatoid Arthritis, Osteoarthritis, Osteomyelitis Neurologic Denies history of Dementia, Neuropathy, Quadriplegia, Paraplegia, Seizure Disorder Oncologic Denies history of Received Chemotherapy, Received Radiation Psychiatric Denies history of Anorexia/bulimia, Confinement Anxiety Review of Systems (ROS) Constitutional Symptoms (General Health) Denies complaints or symptoms of Fatigue, Fever, Chills, Marked Weight Change. Eyes Complains or has symptoms of Vision Changes. Denies complaints or symptoms of Dry Eyes, Glasses / Contacts. Ear/Nose/Mouth/Throat Denies complaints or symptoms of Difficult clearing ears, Sinusitis. Hematologic/Lymphatic PISTOL, KESSENICH ([id number]) Denies complaints or symptoms of Bleeding / Clotting Disorders, Human Immunodeficiency Virus. Respiratory Denies complaints or symptoms of Chronic or frequent coughs, Shortness of Breath. Cardiovascular Denies complaints or symptoms of Chest pain, LE edema. Gastrointestinal Denies complaints or symptoms of Frequent diarrhea, Nausea, Vomiting. Endocrine Denies complaints or symptoms of Hepatitis, Thyroid disease, Polydypsia (Excessive Thirst). Genitourinary Denies complaints or symptoms of Kidney failure/ Dialysis, Incontinence/dribbling. Immunological Denies complaints or symptoms of Hives, Itching. Integumentary (Skin) Denies complaints or symptoms of Wounds, Bleeding or  bruising tendency, Breakdown, Swelling. Musculoskeletal Denies complaints or symptoms of Muscle Pain, Muscle Weakness. Neurologic Denies complaints or symptoms of Numbness/parasthesias, Focal/Weakness. Psychiatric Denies complaints or symptoms of Anxiety, Claustrophobia. Objective Constitutional sitting or standing blood pressure is within target range for patient.. pulse regular and within target range for patient.Marland Kitchen respirations regular, non- labored and within target range for patient.Marland Kitchen temperature within target range for patient.. Well-nourished and well-hydrated in no acute distress. Vitals Time Taken: 9:00 AM, Height: 72 in, Source: Stated, Weight: 154 lbs, Source: Stated, BMI: 20.9, Temperature: 97.9 F, Pulse: 92 bpm, Respiratory Rate: 18 breaths/min, Blood Pressure: 115/65 mmHg. Eyes conjunctiva clear no eyelid edema noted. pupils equal round and reactive to light and accommodation. Ears, Nose, Mouth, and Throat no gross abnormality of ear auricles or external auditory canals. normal hearing noted during conversation. mucus membranes moist. Respiratory normal breathing without difficulty. Cardiovascular 2+ dorsalis pedis/posterior tibialis pulses. no clubbing, cyanosis, significant edema, Musculoskeletal normal gait and posture. no significant deformity or arthritic changes, no loss or range of motion, no clubbing. Psychiatric this patient is able to make decisions and demonstrates good insight into disease process. Alert and Oriented x 3. pleasant and cooperative. General Notes: Upon inspection patient's wound bed actually showed signs again of  complete epithelization there is no open wounds at this time which is great news and overall very pleased with where things stand. Fortunately there is no sign of active infection and overall I think that he is doing quite well. His blood pressure is also up compared at the primary care provider's office likely due to the fact that they  have been making him drink more water especially and he is much better hydrated according to what his daughter tells me. Overall I think that he is doing quite well in that regard. 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 Put garments on first thing when you wake up and remove them before bed. Moisturize legs daily after removing compression garments. Elevate, Exercise Daily and Avoid Standing for Long Periods of Time. Consults ordered were: Vascular - Varicose veins The following medication(s) was prescribed: triamcinolone acetonide topical 0.1 % ointment 0 ointment topical applied in a thin film mixed half and half with Eucerin at bedtime daily x 30 days starting 08/01/2020 1. Would recommend currently that we go ahead and initiate treatment with a continuation of recommendations for compression therapy. I think the patient should have stockings in the 15 to 20 mmHg range. I am afraid anything stronger probably be a lot more difficult for him to get on and to be perfectly honest I think this strength will be appropriate for him currently. 2. I am also going to recommend at this time that the patient going to continue with exercise of walking I think that there is no issues in that regard he should elevate his legs when he sitting. We will see him back for follow-up visit as needed. Electronic Signature(s) Signed: 08/01/2020 9:45:54 AM By: Worthy Keeler PA-C Previous Signature: 08/01/2020 9:43:24 AM Version By: Worthy Keeler PA-C Entered By: Worthy Keeler on 08/01/2020 09:45:54 Spencer Buck  ([id number]) -------------------------------------------------------------------------------- ROS/PFSH Details Patient Name: Spencer Buck Date of Service: 08/01/2020 8:30 AM Medical Record Number: [id number] Patient Account Number: [id number] Date of Birth/Sex: [birth year] (87 y.o. M) Treating RN: Carlene Coria Primary Care Provider: Eda Paschal Other Clinician: Referring Provider: Eda Paschal Treating Provider/Extender: Skipper Cliche in Treatment: 0 Information Obtained From Patient Constitutional Symptoms (General Health) Complaints and Symptoms: Negative for: Fatigue; Fever; Chills; Marked Weight Change Eyes Complaints and Symptoms: Positive for: Vision Changes Negative for: Dry Eyes; Glasses / Contacts Medical History: Negative for: Cataracts; Glaucoma; Optic Neuritis Ear/Nose/Mouth/Throat Complaints and Symptoms: Negative for: Difficult clearing ears; Sinusitis Medical History: Negative for: Chronic sinus problems/congestion; Middle ear problems Hematologic/Lymphatic Complaints and Symptoms: Negative for: Bleeding / Clotting Disorders; Human Immunodeficiency Virus Medical History: Negative for: Anemia; Hemophilia; Human Immunodeficiency Virus; Lymphedema; Sickle Cell Disease Respiratory Complaints and Symptoms: Negative for: Chronic or frequent coughs; Shortness of Breath Medical History: Positive for: Chronic Obstructive Pulmonary Disease (COPD) Negative for: Aspiration; Asthma; Pneumothorax; Sleep Apnea; Tuberculosis Cardiovascular Complaints and Symptoms: Negative for: Chest pain; LE edema Medical History: Negative for: Angina; Arrhythmia; Congestive Heart Failure; Coronary Artery Disease; Deep Vein Thrombosis; Hypertension; Hypotension; Myocardial Infarction; Peripheral Arterial Disease; Peripheral Venous Disease; Phlebitis; Vasculitis Gastrointestinal Complaints and Symptoms: Negative for: Frequent diarrhea; Nausea; Vomiting Medical  History: Negative for: Cirrhosis ; Colitis; Crohnos; Hepatitis A; Hepatitis B; Hepatitis C Endocrine LENY, MOROZOV ([id number]) Complaints  and Symptoms: Negative for: Hepatitis; Thyroid disease; Polydypsia (Excessive Thirst) Medical History: Negative for: Type I Diabetes; Type II Diabetes Genitourinary Complaints and Symptoms: Negative for: Kidney failure/ Dialysis; Incontinence/dribbling Medical History: Negative for: End Stage Renal Disease Immunological Complaints and Symptoms: Negative for: Hives; Itching Medical History: Negative for: Lupus Erythematosus; Raynaudos; Scleroderma Integumentary (Skin) Complaints and Symptoms: Negative for: Wounds; Bleeding or bruising tendency; Breakdown; Swelling Medical History: Negative for: History of Burn; History of pressure wounds Musculoskeletal Complaints and Symptoms: Negative for: Muscle Pain; Muscle Weakness Medical History: Negative for: Gout; Rheumatoid Arthritis; Osteoarthritis; Osteomyelitis Neurologic Complaints and Symptoms: Negative for: Numbness/parasthesias; Focal/Weakness Medical History: Negative for: Dementia; Neuropathy; Quadriplegia; Paraplegia; Seizure Disorder Psychiatric Complaints and Symptoms: Negative for: Anxiety; Claustrophobia Medical History: Negative for: Anorexia/bulimia; Confinement Anxiety Oncologic Medical History: Negative for: Received Chemotherapy; Received Radiation Immunizations Pneumococcal Vaccine: Received Pneumococcal Vaccination: No Implantable Devices None Family and Social History Current every day smoker; Marital Status - Married; Alcohol Use: Moderate; Drug Use: No History; Caffeine Use: Daily; Financial Concerns: No; Food, Clothing or Shelter Needs: No; Support System Lacking: No; Transportation Concerns: No WYNTON, HUFSTETLER ([id number]) Electronic Signature(s) Signed: 08/01/2020 9:40:15 AM By: Carlene Coria RN Signed: 08/01/2020 4:58:35 PM By: Worthy Keeler PA-C Entered  By: Carlene Coria on 08/01/2020 09:10:30 Spencer Buck ([id number]) -------------------------------------------------------------------------------- SuperBill Details Patient Name: Spencer Buck Date of Service: 08/01/2020 Medical Record Number: [id number] Patient Account Number: [id number] Date of Birth/Sex: 04/16/1931 (87 y.o. M) Treating RN: Dolan Amen Primary Care Provider: Eda Paschal Other Clinician: Referring Provider: Eda Paschal Treating Provider/Extender: Skipper Cliche in Treatment: 0 Diagnosis Coding ICD-10 Codes Code Description I87.2 Venous insufficiency (chronic) (peripheral) J44.9 Chronic obstructive pulmonary disease, unspecified Facility Procedures CPT4 Code: [id number] Description: [id number] - WOUND CARE VISIT-LEV 2 EST PT Modifier: Quantity: 1 Physician Procedures CPT4 Code: [id number] Description: 97989 - WC PHYS LEVEL 4 - NEW PT Modifier: Quantity: 1 CPT4 Code: Description: ICD-10 Diagnosis Description I87.2 Venous insufficiency (chronic) (peripheral) J44.9 Chronic obstructive pulmonary disease, unspecified Modifier: Quantity: Electronic Signature(s) Signed: 08/01/2020 9:45:40 AM By: Worthy Keeler PA-C Entered By: Worthy Keeler on 08/01/2020 09:45:40

## 2020-08-01 NOTE — Progress Notes (Signed)
ARGELIO, GRANIER ([id number]) Visit Report for 08/01/2020 Abuse/Suicide Risk Screen Details Patient Name: Spencer Buck, Spencer Buck Date of Service: 08/01/2020 8:30 AM Medical Record Number: [id number] Patient Account Number: [id number] Date of Birth/Sex: 09-25-1930 (87 y.o. Male) Treating RN: Carlene Coria Primary Care Gissella Niblack: Eda Paschal Other Clinician: Referring Khiana Camino: Eda Paschal Treating Cordarro Spinnato/Extender: Skipper Cliche in Treatment: 0 Abuse/Suicide Risk Screen Items Answer ABUSE RISK SCREEN: Has anyone close to you tried to hurt or harm you recentlyo No Do you feel uncomfortable with anyone in your familyo No Has anyone forced you do things that you didnot want to doo No Electronic Signature(s) Signed: 08/01/2020 9:40:15 AM By: Carlene Coria RN Entered By: Carlene Coria on 08/01/2020 09:10:44 Spencer Buck ([id number]) -------------------------------------------------------------------------------- Activities of Daily Living Details Patient Name: Spencer Buck Date of Service: 08/01/2020 8:30 AM Medical Record Number: [id number] Patient Account Number: [id number] Date of Birth/Sex: 04/22/1931 (87 y.o. Male) Treating RN: Carlene Coria Primary Care Riddick Nuon: Eda Paschal Other Clinician: Referring Kiyla Ringler: Eda Paschal Treating Olie Scaffidi/Extender: Skipper Cliche in Treatment: 0 Activities of Daily Living Items Answer Activities of Daily Living (Please select one for each item) Drive Automobile Completely Able Take Medications Completely Able Use Telephone Completely Able Care for Appearance Completely Able Use Toilet Completely Able Bath / Shower Completely Able Dress Self Completely Able Feed Self Completely Able Walk Completely Able Get In / Out Bed Completely [place] for Self Completely Able Electronic Signature(s) Signed: 08/01/2020 9:40:15 AM By: Carlene Coria  RN Entered By: Carlene Coria on 08/01/2020 09:11:11 Spencer Buck ([id number]) -------------------------------------------------------------------------------- Education Screening Details Patient Name: Spencer Buck Date of Service: 08/01/2020 8:30 AM Medical Record Number: [id number] Patient Account Number: [id number] Date of Birth/Sex: March 23, 1931 (87 y.o. Male) Treating RN: Carlene Coria Primary Care Rettie Laird: Eda Paschal Other Clinician: Referring Kristoph Sattler: Eda Paschal Treating Laren Orama/Extender: Skipper Cliche in Treatment: 0 Learning Preferences/Education Level/Primary Language Learning Preference: Explanation Highest Education Level: College or Above Preferred Language: English Cognitive Barrier Language Barrier: No Translator Needed: No Memory Deficit: No Emotional Barrier: No Cultural/Religious Beliefs Affecting Medical Care: No Physical Barrier Impaired Vision: No Impaired Hearing: No Decreased Hand dexterity: No Knowledge/Comprehension Knowledge Level: Medium Comprehension Level: High Ability to understand written instructions: High Ability to understand verbal instructions: High Motivation Anxiety Level: Anxious Cooperation: Cooperative Education Importance: Acknowledges Need Interest in Health Problems: Asks Questions Perception: Coherent Willingness to Engage in Self-Management High Activities: Readiness to Engage in Self-Management High Activities: Electronic Signature(s) Signed: 08/01/2020 9:40:15 AM By: Carlene Coria RN Entered By: Carlene Coria on 08/01/2020 09:11:48 Spencer Buck ([id number]) -------------------------------------------------------------------------------- Fall Risk Assessment Details Patient Name: Spencer Buck Date of Service: 08/01/2020 8:30 AM Medical Record Number: [id number] Patient Account Number: [id number] Date of Birth/Sex: July 23, 1930 (87 y.o. Male) Treating RN: Carlene Coria Primary Care Scotti Motter: Eda Paschal Other Clinician: Referring Nekoda Chock: Eda Paschal Treating [place] Whedbee/Extender: Skipper Cliche in Treatment: 0 Fall Risk Assessment Items Have you had 2 or more falls in the last 12 monthso 0 No Have you had any fall that resulted in injury in the last 12 monthso 0 No FALLS RISK SCREEN History of falling - immediate or within 3 months 0 No Secondary diagnosis (Do you have 2 or more medical diagnoseso) 0 No Ambulatory aid None/bed rest/wheelchair/nurse 0 No Crutches/cane/walker 0 No Furniture 0 No Intravenous therapy Access/Saline/Heparin Lock 0 No Gait/Transferring Normal/ bed rest/ wheelchair 0 No Weak (short steps with or without shuffle, stooped but able to lift head while walking, may  0 No seek support from furniture) Impaired (short steps with shuffle, may have difficulty arising from chair, head down, impaired 0 No balance) Mental Status Oriented to own ability 0 No Electronic Signature(s) Signed: 08/01/2020 9:40:15 AM By: Carlene Coria RN Entered By: Carlene Coria on 08/01/2020 09:11:54 Spencer Buck ([id number]) -------------------------------------------------------------------------------- Foot Assessment Details Patient Name: Spencer Buck Date of Service: 08/01/2020 8:30 AM Medical Record Number: [id number] Patient Account Number: [id number] Date of Birth/Sex: [birth year] (87 y.o. Male) Treating RN: Carlene Coria Primary Care Janis Sol: Eda Paschal Other Clinician: Referring Carlise Stofer: Eda Paschal Treating Cassie Henkels/Extender: Skipper Cliche in Treatment: 0 Foot Assessment Items Site Locations + = Sensation present, - = Sensation absent, C = Callus, U = Ulcer R = Redness, W = Warmth, M = Maceration, PU = Pre-ulcerative lesion F = Fissure, S = Swelling, D = Dryness Assessment Right: Left: Other Deformity: No No Prior Foot Ulcer: No No Prior Amputation: No No Charcot Joint: No No Ambulatory Status: Ambulatory Without Help Gait:  Steady Electronic Signature(s) Signed: 08/01/2020 9:40:15 AM By: Carlene Coria RN Entered By: Carlene Coria on 08/01/2020 09:14:41 Spencer Buck ([id number]) -------------------------------------------------------------------------------- Nutrition Risk Screening Details Patient Name: Spencer Buck Date of Service: 08/01/2020 8:30 AM Medical Record Number: [id number] Patient Account Number: [id number] Date of Birth/Sex: 30-Jun-1930 (87 y.o. Male) Treating RN: Carlene Coria Primary Care Oseph Imburgia: Eda Paschal Other Clinician: Referring Lynell Greenhouse: Eda Paschal Treating Keylor Rands/Extender: Skipper Cliche in Treatment: 0 Height (in): 72 Weight (lbs): 154 Body Mass Index (BMI): 20.9 Nutrition Risk Screening Items Score Screening NUTRITION RISK SCREEN: I have an illness or condition that made me change the kind and/or amount of food I eat 0 No I eat fewer than two meals per day 0 No I eat few fruits and vegetables, or milk products 0 No I have three or more drinks of beer, liquor or wine almost every day 0 No I have tooth or mouth problems that make it hard for me to eat 0 No I don't always have enough money to buy the food I need 0 No I eat alone most of the time 0 No I take three or more different prescribed or over-the-counter drugs a day 1 Yes Without wanting to, I have lost or gained 10 pounds in the last six months 0 No I am not always physically able to shop, cook and/or feed myself 0 No Nutrition Protocols Good Risk Protocol 0 No interventions needed Moderate Risk Protocol High Risk Proctocol Risk Level: Good Risk Score: 1 Electronic Signature(s) Signed: 08/01/2020 9:40:15 AM By: Carlene Coria RN Entered By: Carlene Coria on 08/01/2020 09:12:03

## 2020-08-05 DIAGNOSIS — R9431 Abnormal electrocardiogram [ECG] [EKG]: Secondary | ICD-10-CM | POA: Insufficient documentation

## 2020-08-05 DIAGNOSIS — I739 Peripheral vascular disease, unspecified: Secondary | ICD-10-CM | POA: Insufficient documentation

## 2020-08-05 DIAGNOSIS — Z720 Tobacco use: Secondary | ICD-10-CM | POA: Insufficient documentation

## 2020-08-05 DIAGNOSIS — R600 Localized edema: Secondary | ICD-10-CM | POA: Insufficient documentation

## 2020-08-05 DIAGNOSIS — I519 Heart disease, unspecified: Secondary | ICD-10-CM | POA: Insufficient documentation

## 2020-08-05 DIAGNOSIS — I4510 Unspecified right bundle-branch block: Secondary | ICD-10-CM | POA: Insufficient documentation

## 2020-08-05 DIAGNOSIS — I493 Ventricular premature depolarization: Secondary | ICD-10-CM | POA: Insufficient documentation

## 2020-08-19 ENCOUNTER — Encounter: Payer: Self-pay | Admitting: Physician Assistant

## 2020-08-19 ENCOUNTER — Ambulatory Visit (INDEPENDENT_AMBULATORY_CARE_PROVIDER_SITE_OTHER): Payer: Medicare PPO | Admitting: Physician Assistant

## 2020-08-19 ENCOUNTER — Other Ambulatory Visit: Payer: Self-pay

## 2020-08-19 VITALS — BP 111/59 | HR 81 | Ht 72.0 in | Wt 156.8 lb

## 2020-08-19 DIAGNOSIS — Z87448 Personal history of other diseases of urinary system: Secondary | ICD-10-CM | POA: Diagnosis not present

## 2020-08-19 DIAGNOSIS — N138 Other obstructive and reflux uropathy: Secondary | ICD-10-CM | POA: Diagnosis not present

## 2020-08-19 DIAGNOSIS — N401 Enlarged prostate with lower urinary tract symptoms: Secondary | ICD-10-CM | POA: Diagnosis not present

## 2020-08-19 DIAGNOSIS — Z87898 Personal history of other specified conditions: Secondary | ICD-10-CM | POA: Diagnosis not present

## 2020-08-19 LAB — BLADDER SCAN AMB NON-IMAGING

## 2020-08-19 NOTE — Patient Instructions (Signed)

## 2020-08-19 NOTE — Progress Notes (Signed)
08/19/2020 1:47 PM   Spencer Buck 1931/01/21 [id number]  CC: Chief Complaint  Patient presents with  . Follow-up   HPI: Spencer Buck is a 87 y.o. 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  Today he reports 2 episodes in the past week of the inability to urinate for approximately 3 hours, beginning immediately after awakening.  He subsequently regained the ability to void on both occasions.  He denies lower abdominal or flank discomfort today.  He reports ongoing difficulty with weak stream, however states he feels this is age-appropriate.  He is taking Flomax twice daily.  He reports he was previously instructed in self-catheterization by a PA at his last urologist in [place], however he has never needed to do so.  He is not interested in pursuing bladder outlet procedures today.  PVR 222 mL.  Additionally, on review of his outside medical records, he has a history of left hydronephrosis without hydroureteronephrosis.  He was noted to have decreased left renal perfusion, 38%.  He previously had a left ureteral stent placed, which has been removed cystoscopically by his prior urologist on 11/10/2019.  Patient provides recent labs, drawn 06/11/2020 by his PCP.  These are notable for creatinine 1.2, previously 1.93 on 03/09/2020 in the setting of acute urinary retention.  Urine cytology dated 10/02/2019 was negative for high-grade urothelial carcinoma and notable for abundant acute inflammation.  PMH: Past Medical History:  Diagnosis Date  . BPH (benign prostatic hyperplasia)   . Urinary retention     Surgical History: Past Surgical History:  Procedure Laterality Date  . HERNIA REPAIR    . KNEE SURGERY Left     Home Medications:  Allergies as of 08/19/2020   No Known Allergies     Medication List       Accurate as of August 19, 2020   1:47 PM. If you have any questions, ask your nurse or doctor.        STOP taking these medications   sulfamethoxazole-trimethoprim 800-160 MG tablet Commonly known as: BACTRIM DS Stopped by: Debroah Loop, PA-C     TAKE these medications   finasteride 5 MG tablet Commonly known as: PROSCAR Take 5 mg by mouth daily.   tamsulosin 0.4 MG Caps capsule Commonly known as: FLOMAX Take 0.8 mg by mouth daily.   triamcinolone cream 0.5 % Commonly known as: KENALOG APPLY A SMALL AMOUNT TWICE DAILY AS NEEDED       Allergies:  No Known Allergies  Family History: No family history on file.  Social History:   reports that he has been smoking. He has never used smokeless tobacco. He reports current alcohol use. He reports that he does not use drugs.  Physical Exam: BP (!) 111/59 (BP Location: Left Arm, Patient Position: Sitting, Cuff Size: Normal)   Pulse 81   Ht 6' (1.829 m)   Wt 156 lb 12.8 oz (71.1 kg)   BMI 21.27 kg/m   Constitutional:  Alert and oriented, no acute distress, nontoxic appearing HEENT: [place], AT Cardiovascular: No clubbing, cyanosis, or edema Respiratory: Normal respiratory effort, no increased work of breathing Skin: No rashes, bruises or suspicious lesions Neurologic: Grossly intact, no focal deficits, moving all 4 extremities Psychiatric: Normal mood and affect  Laboratory Data: Results for orders placed or performed in visit on 08/19/20  Bladder Scan (Post Void Residual) in office  Result Value Ref  Range   Scan Result 223mL    Assessment & Plan:   1. Benign prostatic hyperplasia with urinary obstruction PVR slightly elevated, however patient is asymptomatic.  Counseled him to continue Flomax twice daily and finasteride.  Provided catheter samples today for self-catheterization as needed if he develops urinary retention.  Counseled patient to return to clinic with urinary retention.  He expressed understanding. - Bladder Scan (Post Void Residual)  in office  2. History of hydronephrosis Recent creatinine WNL.  We will plan for renal ultrasound and repeat BMP in 3 months.  Patient is in agreement with this plan. - Basic metabolic panel; Future - Ultrasound renal complete; Future   Return in about 3 months (around 11/19/2020) for Follow up w/ renal US and BMP prior .  Debroah Loop, PA-C  [facility] [address] [place], [place] [address] [phone]

## 2020-08-21 ENCOUNTER — Encounter: Payer: Self-pay | Admitting: Physician Assistant

## 2020-08-21 ENCOUNTER — Ambulatory Visit (INDEPENDENT_AMBULATORY_CARE_PROVIDER_SITE_OTHER): Payer: Medicare PPO | Admitting: Physician Assistant

## 2020-08-21 ENCOUNTER — Other Ambulatory Visit: Payer: Self-pay

## 2020-08-21 VITALS — BP 106/64 | HR 90 | Ht 72.0 in | Wt 156.0 lb

## 2020-08-21 DIAGNOSIS — R339 Retention of urine, unspecified: Secondary | ICD-10-CM

## 2020-08-21 DIAGNOSIS — R338 Other retention of urine: Secondary | ICD-10-CM | POA: Diagnosis not present

## 2020-08-21 DIAGNOSIS — N401 Enlarged prostate with lower urinary tract symptoms: Secondary | ICD-10-CM | POA: Diagnosis not present

## 2020-08-21 LAB — BLADDER SCAN AMB NON-IMAGING

## 2020-08-21 MED ORDER — FOSFOMYCIN TROMETHAMINE 3 G PO PACK: 3.0000 g | PACK | Freq: Once | ORAL | 0 refills | Status: AC

## 2020-08-21 MED ORDER — FOSFOMYCIN TROMETHAMINE 3 G PO PACK: 3.0000 g | PACK | Freq: Once | ORAL | 0 refills | Status: DC

## 2020-08-21 NOTE — Progress Notes (Signed)
08/21/2020 10:51 AM   Spencer Buck 30-Dec-1930 [id number]  CC: Chief Complaint  Patient presents with  . Benign Prostatic Hypertrophy   HPI: Spencer Buck is a 87 y.o. 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    I saw him in clinic most recently 2 days ago for 42-month repeat PVR.  At that time, he reported 2 recent episodes of the inability to urinate that spontaneously resolved after approximately 3 hours.  PVR 222 at that time and he denied lower abdominal discomfort. He had previously been instructed in self-catheterization and I provided him catheters for as needed use at home.  Bladder scan with 174 mL on arrival to clinic.  Today he reports the inability to spontaneously void since 11:30 PM last night.  He has self catheterized twice with "good" urinary output each time.  He denies bleeding or pain with self-catheterization and states this is going well.  He is not ready to pursue bladder outlet surgery and does not wish for Foley placement today.  He was able to spontaneously void for me today to provide a urine sample for urinalysis.  In-office UA today positive for trace intact blood and 1+ leukocyte ester; urine microscopy with 11-30 WBCs/HPF and 3-10 RBCs/HPF.  PMH: Past Medical History:  Diagnosis Date  . BPH (benign prostatic hyperplasia)   . Urinary retention     Surgical History: Past Surgical History:  Procedure Laterality Date  . HERNIA REPAIR    . KNEE SURGERY Left     Home Medications:  Allergies as of 08/21/2020   No Known Allergies     Medication List       Accurate as of August 21, 2020 10:51 AM. If you have any questions, ask your nurse or doctor.        finasteride 5 MG tablet Commonly known as: PROSCAR Take 5 mg by mouth daily.   tamsulosin 0.4 MG Caps capsule Commonly known as:  FLOMAX Take 0.8 mg by mouth daily.   triamcinolone cream 0.5 % Commonly known as: KENALOG APPLY A SMALL AMOUNT TWICE DAILY AS NEEDED       Allergies:  No Known Allergies  Family History: No family history on file.  Social History:   reports that he has been smoking. He has never used smokeless tobacco. He reports current alcohol use. He reports that he does not use drugs.  Physical Exam: BP 106/64   Pulse 90   Ht 6' (1.829 m)   Wt 156 lb (70.8 kg)   BMI 21.16 kg/m   Constitutional:  Alert and oriented, no acute distress, nontoxic appearing HEENT: [place], AT Cardiovascular: No clubbing, cyanosis, or edema Respiratory: Normal respiratory effort, no increased work of breathing Skin: No rashes, bruises or suspicious lesions Neurologic: Grossly intact, no focal deficits, moving all 4 extremities Psychiatric: Normal mood and affect  Laboratory Data: Results for orders placed or performed in visit on 08/21/20  CULTURE, URINE COMPREHENSIVE   Specimen: Urine   UR  Result Value Ref Range   Urine Culture, Comprehensive Preliminary report    Organism ID, Bacteria Comment   Microscopic Examination   Urine  Result Value Ref Range   WBC, UA 11-30 (A) 0 - 5 /hpf   RBC 3-10 (A) 0 - 2 /hpf   Epithelial Cells (non renal) 0-10 0 - 10 /hpf   Bacteria, UA None  seen None seen/Few  Urinalysis, Complete  Result Value Ref Range   Specific Gravity, UA 1.015 1.005 - 1.030   pH, UA 7.0 5.0 - 7.5   Color, UA Yellow Yellow   Appearance Ur Cloudy (A) Clear   Leukocytes,UA 1+ (A) Negative   Protein,UA Negative Negative/Trace   Glucose, UA Negative Negative   Ketones, UA Negative Negative   RBC, UA Trace (A) Negative   Bilirubin, UA Negative Negative   Urobilinogen, Ur 0.2 0.2 - 1.0 mg/dL   Nitrite, UA Negative Negative   Microscopic Examination See below:   Bladder Scan (Post Void Residual) in office  Result Value Ref Range   Scan Result 117mL    Assessment & Plan:   1. Benign  prostatic hyperplasia with urinary retention Patient is self cathing without difficulty and does not wish to pursue surgery or chronic indwelling Foley at this time.  UA consistent with self-catheterization versus UTI, will treat with empiric fosfomycin and send for culture for further evaluation.  I explained the treatment for possible UTI may or may not change his ability to spontaneously void.  I explained that his new urinary retention may represent the natural course of his BPH on maximal pharmacotherapy.  I explained that his treatment options include chronic indwelling Foley catheter, CIC, and bladder outlet surgery.  I explained that the work-up for bladder outlet surgery would include cystoscopy and TRUS and that these can be completed in a single visit in our office.  Patient does not wish to pursue these at this time.  I counseled him to contact me if and when he changes his mind for scheduling.  Otherwise, I have set him up with a [employer] order today to send supplies to his home.  Patient expressed understanding. - Bladder Scan (Post Void Residual) in office - Urinalysis, Complete - CULTURE, URINE COMPREHENSIVE - fosfomycin (MONUROL) 3 g PACK; Take 3 g by mouth once for 1 dose.  Dispense: 3 g; Refill: 0   Return if symptoms worsen or fail to improve.  Debroah Loop, PA-C  [facility] [address], [place], [place] [address] [phone]

## 2020-08-21 NOTE — Patient Instructions (Signed)
Take your antibiotics today. Continue to self-catheterize as needed. I'll get your set up to receive supplies at your home. If you run out and need more, call our office. If you would like to think about prostate surgery, please call our office and I'll set up an appointment for you for a cystoscopy and transrectal ultrasound to evaluate your prostate.

## 2020-08-22 ENCOUNTER — Encounter (INDEPENDENT_AMBULATORY_CARE_PROVIDER_SITE_OTHER): Payer: Medicare PPO | Admitting: Vascular Surgery

## 2020-08-23 LAB — URINALYSIS, COMPLETE
Bilirubin, UA: NEGATIVE
Glucose, UA: NEGATIVE
Ketones, UA: NEGATIVE
Nitrite, UA: NEGATIVE
Protein,UA: NEGATIVE
Specific Gravity, UA: 1.015 (ref 1.005–1.030)
Urobilinogen, Ur: 0.2 mg/dL (ref 0.2–1.0)
pH, UA: 7 (ref 5.0–7.5)

## 2020-08-23 LAB — MICROSCOPIC EXAMINATION: Bacteria, UA: NONE SEEN

## 2020-08-24 LAB — CULTURE, URINE COMPREHENSIVE

## 2020-08-25 DIAGNOSIS — I872 Venous insufficiency (chronic) (peripheral): Secondary | ICD-10-CM | POA: Insufficient documentation

## 2020-08-25 DIAGNOSIS — I890 Lymphedema, not elsewhere classified: Secondary | ICD-10-CM | POA: Insufficient documentation

## 2020-08-25 NOTE — Progress Notes (Signed)
MRN : [id number]  Spencer Buck is a 87 y.o. ([birth year]) male who presents with chief complaint of No chief complaint on file. Marland Kitchen  History of Present Illness:   Patient is seen for evaluation of leg pain and leg swelling. The patient first noticed the swelling remotely. The swelling is associated with pain and discoloration. The pain and swelling worsens with prolonged dependency and improves with elevation. The pain is unrelated to activity.  The patient notes that in the morning the legs are significantly improved but they steadily worsened throughout the course of the day. The patient also notes a steady worsening of the discoloration in the ankle and shin area.   The patient denies claudication symptoms.  The patient denies symptoms consistent with rest pain.  The patient denies and extensive history of DJD and LS spine disease.  The patient has no had any past angiography, interventions or vascular surgery.  Elevation makes the leg symptoms better, dependency makes them much worse. There is no history of ulcerations. The patient denies any recent changes in medications.  The patient has not been wearing graduated compression.  The patient denies a history of DVT or PE. There is no prior history of phlebitis. There is no history of primary lymphedema.  No history of malignancies. No history of trauma or groin or pelvic surgery. There is no history of radiation treatment to the groin or pelvis  The patient denies amaurosis fugax or recent TIA symptoms. There are no recent neurological changes noted. The patient denies recent episodes of angina or shortness of breath  No outpatient medications have been marked as taking for the 08/26/20 encounter (Appointment) with Delana Meyer, Dolores Lory, MD.    Past Medical History:  Diagnosis Date  . BPH (benign prostatic hyperplasia)   . Urinary retention     Past Surgical History:  Procedure Laterality Date  . HERNIA REPAIR    . KNEE  SURGERY Left     Social History Social History   Tobacco Use  . Smoking status: Current Every Day Smoker  . Smokeless tobacco: Never Used  Substance Use Topics  . Alcohol use: Yes    Comment: 1/2 beer every night   . Drug use: Never    Family History No family history of bleeding/clotting disorders, porphyria or autoimmune disease   No Known Allergies   REVIEW OF SYSTEMS (Negative unless checked)  Constitutional: [] Weight loss  [] Fever  [] Chills Cardiac: [] Chest pain   [] Chest pressure   [] Palpitations   [] Shortness of breath when laying flat   [] Shortness of breath with exertion. Vascular:  [] Pain in legs with walking   [x] Pain in legs at rest  [] History of DVT   [] Phlebitis   [x] Swelling in legs   [x] Varicose veins   [] Non-healing ulcers Pulmonary:   [] Uses home oxygen   [] Productive cough   [] Hemoptysis   [] Wheeze  [] COPD   [] Asthma Neurologic:  [] Dizziness   [] Seizures   [] History of stroke   [] History of TIA  [] Aphasia   [] Vissual changes   [] Weakness or numbness in arm   [] Weakness or numbness in leg Musculoskeletal:   [] Joint swelling   [x] Joint pain   [] Low back pain Hematologic:  [] Easy bruising  [] Easy bleeding   [] Hypercoagulable state   [] Anemic Gastrointestinal:  [] Diarrhea   [] Vomiting  [] Gastroesophageal reflux/heartburn   [] Difficulty swallowing. Genitourinary:  [] Chronic kidney disease   [] Difficult urination  [] Frequent urination   [] Blood in urine Skin:  [x] Rashes   [] Ulcers  Psychological:  []   History of anxiety   []  History of major depression.  Physical Examination  There were no vitals filed for this visit. There is no height or weight on file to calculate BMI. Gen: WD/WN, NAD Head: [place]/AT, No temporalis wasting.  Ear/Nose/Throat: Hearing grossly intact, nares w/o erythema or drainage, poor dentition Eyes: PER, EOMI, sclera nonicteric.  Neck: Supple, no masses.  No bruit or JVD.  Pulmonary:  Good air movement, clear to auscultation bilaterally, no use  of accessory muscles.  Cardiac: RRR, normal S1, S2, no Murmurs. Vascular: scattered varicosities present bilaterally.  Mild venous stasis changes to the legs bilaterally.  2+ soft pitting edema Vessel Right Left  Radial Palpable Palpable  PT Palpable Palpable  DP Palpable Palpable  Gastrointestinal: soft, non-distended. No guarding/no peritoneal signs.  Musculoskeletal: M/S 5/5 throughout.  No deformity or atrophy.  Neurologic: CN 2-12 intact. Pain and light touch intact in extremities.  Symmetrical.  Speech is fluent. Motor exam as listed above. Psychiatric: Judgment intact, Mood & affect appropriate for pt's clinical situation. Dermatologic: No rashes or ulcers noted.  No changes consistent with cellulitis. Lymph : No Cervical lymphadenopathy, no lichenification or skin changes of chronic lymphedema.  CBC Lab Results  Component Value Date   WBC 11.3 (H) 03/09/2020   HGB 12.6 (L) 03/09/2020   HCT 36.0 (L) 03/09/2020   MCV 83.9 03/09/2020   PLT 121 (L) 03/09/2020    BMET    Component Value Date/Time   NA 133 (L) 03/09/2020 1433   K 4.9 03/09/2020 1433   CL 100 03/09/2020 1433   CO2 23 03/09/2020 1433   GLUCOSE 99 03/09/2020 1433   BUN 31 (H) 03/09/2020 1433   CREATININE 1.93 (H) 03/09/2020 1433   CALCIUM 9.1 03/09/2020 1433   GFRNONAA 30 (L) 03/09/2020 1433   GFRAA 35 (L) 03/09/2020 1433   CrCl cannot be calculated (Patient's most recent lab result is older than the maximum 21 days allowed.).  COAG No results found for: INR, PROTIME  Radiology No results found.    Assessment/Plan 1. Chronic venous insufficiency No surgery or intervention at this point in time.  I have reviewed my discussion with the patient regarding venous insufficiency and why it causes symptoms. I have discussed with the patient the chronic skin changes that accompany venous insufficiency and the long term sequela such as ulceration. Patient will contnue wearing graduated compression stockings  on a daily basis, as this has provided excellent control of his edema. The patient will put the stockings on first thing in the morning and removing them in the evening. The patient is reminded not to sleep in the stockings.  In addition, behavioral modification including elevation during the day will be initiated. Exercise is strongly encouraged.  Given the patient's good control and lack of any problems regarding the venous insufficiency and lymphedema a lymph pump in not need at this time.  The patient will follow up with me PRN should anything change.  The patient voices agreement with this plan.   2. Lymphedema No surgery or intervention at this point in time.  I have reviewed my discussion with the patient regarding venous insufficiency and why it causes symptoms. I have discussed with the patient the chronic skin changes that accompany venous insufficiency and the long term sequela such as ulceration. Patient will contnue wearing graduated compression stockings on a daily basis, as this has provided excellent control of his edema. The patient will put the stockings on first thing in the morning and  removing them in the evening. The patient is reminded not to sleep in the stockings.  In addition, behavioral modification including elevation during the day will be initiated. Exercise is strongly encouraged.  Given the patient's good control and lack of any problems regarding the venous insufficiency and lymphedema a lymph pump in not need at this time.  The patient will follow up with me PRN should anything change.  The patient voices agreement with this plan.     Hortencia Pilar, MD  08/25/2020 11:40 AM

## 2020-08-26 ENCOUNTER — Ambulatory Visit (INDEPENDENT_AMBULATORY_CARE_PROVIDER_SITE_OTHER): Payer: Medicare PPO | Admitting: Vascular Surgery

## 2020-08-26 ENCOUNTER — Telehealth: Payer: Self-pay

## 2020-08-26 ENCOUNTER — Encounter (INDEPENDENT_AMBULATORY_CARE_PROVIDER_SITE_OTHER): Payer: Self-pay | Admitting: Vascular Surgery

## 2020-08-26 ENCOUNTER — Other Ambulatory Visit: Payer: Self-pay

## 2020-08-26 DIAGNOSIS — I890 Lymphedema, not elsewhere classified: Secondary | ICD-10-CM | POA: Diagnosis not present

## 2020-08-26 DIAGNOSIS — I872 Venous insufficiency (chronic) (peripheral): Secondary | ICD-10-CM

## 2020-08-26 NOTE — Telephone Encounter (Signed)
Who is their catheter supplier and can they fax the order to [place]?  I do not see one in the chart.  It looks like he caths when needed.

## 2020-08-26 NOTE — Telephone Encounter (Signed)
-----   Message from Nori Riis, PA-C sent at 08/26/2020 10:30 AM EDT ----- Please let Mr. Eugenio know that his urine culture was positive and that we should recheck his to ensure the infection has resolved in one month.

## 2020-08-26 NOTE — Telephone Encounter (Signed)
OK per DPR, Notified patients daughter as advised. Made appt for 1 mo UA recheck. Patients daughter would also like to know if patient should be self catheterizing everyday or only as needed. She reports patient has not had to self cath since he was seen in our office last week but patients medical supplier who is doing his [employer] order is saying he should be cathing 6 times a day. Please advise.

## 2020-08-30 ENCOUNTER — Encounter (INDEPENDENT_AMBULATORY_CARE_PROVIDER_SITE_OTHER): Payer: Self-pay | Admitting: Vascular Surgery

## 2020-09-02 ENCOUNTER — Telehealth (INDEPENDENT_AMBULATORY_CARE_PROVIDER_SITE_OTHER): Payer: Self-pay | Admitting: Vascular Surgery

## 2020-09-02 NOTE — Telephone Encounter (Signed)
Called stating that patient was referred from [place] to them for wounds. Spencer Buck called patient's daughter to schedule and daughter told them he doesn't have any wounds. Spencer Buck wanted to know if there is another reason for referral. Patient was last seen 08/26/20 as new patient with [facility]. Please advise.

## 2020-09-02 NOTE — Telephone Encounter (Signed)
I would discuss directly with Dr. Delana Meyer as I'm not entirely certain

## 2020-09-05 NOTE — Telephone Encounter (Signed)
New [employer] form sent 3/15 to reduce number of catheters being sent to patient. Patients original [employer] form included 6 catheters a day, per Larene Beach, reduced order to 2x daily. Order form in chart.

## 2020-09-17 ENCOUNTER — Other Ambulatory Visit: Payer: Self-pay

## 2020-09-17 DIAGNOSIS — N390 Urinary tract infection, site not specified: Secondary | ICD-10-CM

## 2020-09-18 ENCOUNTER — Encounter: Payer: Self-pay | Admitting: Internal Medicine

## 2020-09-23 ENCOUNTER — Other Ambulatory Visit: Payer: Self-pay

## 2020-09-23 ENCOUNTER — Other Ambulatory Visit: Payer: Medicare PPO

## 2020-09-23 DIAGNOSIS — N390 Urinary tract infection, site not specified: Secondary | ICD-10-CM

## 2020-09-24 LAB — URINALYSIS, COMPLETE
Bilirubin, UA: NEGATIVE
Glucose, UA: NEGATIVE
Ketones, UA: NEGATIVE
Nitrite, UA: NEGATIVE
Specific Gravity, UA: 1.01 (ref 1.005–1.030)
Urobilinogen, Ur: 0.2 mg/dL (ref 0.2–1.0)
pH, UA: 6 (ref 5.0–7.5)

## 2020-09-24 LAB — MICROSCOPIC EXAMINATION: WBC, UA: >30 /hpf — AB (ref 0–5)

## 2020-09-25 ENCOUNTER — Telehealth: Payer: Self-pay

## 2020-09-25 NOTE — Telephone Encounter (Signed)
-----   Message from Debroah Loop, [place] sent at 09/24/2020  5:11 PM EDT ----- Please call him and ask if he's having any UTI symptoms: dysuria, urgency, frequency, lower abdominal pain, low back pain, fever, chills, nausea, or vomiting. If so, will need to obtain another urine sample for culture; unfortunately I did not receive his urinalysis results until after our lab had closed for the day and the sample has been lost. If asymptomatic, no further treatment indicated and he should follow-up with Dr. Bernardo Heater as scheduled. ----- Message ----- From: Lavone Neri Lab Results In Sent: 09/24/2020  11:37 AM EDT To: Debroah Loop, PA-C

## 2020-09-25 NOTE — Telephone Encounter (Signed)
Patient reports no symptoms at this time, states they will call should that change.

## 2020-10-03 ENCOUNTER — Other Ambulatory Visit: Payer: Self-pay | Admitting: *Deleted

## 2020-10-03 ENCOUNTER — Encounter: Payer: Self-pay | Admitting: *Deleted

## 2020-10-03 ENCOUNTER — Inpatient Hospital Stay: Payer: Medicare PPO

## 2020-10-03 ENCOUNTER — Inpatient Hospital Stay: Payer: Medicare PPO | Attending: Internal Medicine | Admitting: Internal Medicine

## 2020-10-03 VITALS — BP 142/91 | HR 83 | Temp 97.8°F | Resp 20 | Ht 72.0 in | Wt 157.0 lb

## 2020-10-03 DIAGNOSIS — J449 Chronic obstructive pulmonary disease, unspecified: Secondary | ICD-10-CM | POA: Diagnosis not present

## 2020-10-03 DIAGNOSIS — R112 Nausea with vomiting, unspecified: Secondary | ICD-10-CM | POA: Diagnosis not present

## 2020-10-03 DIAGNOSIS — D72820 Lymphocytosis (symptomatic): Secondary | ICD-10-CM

## 2020-10-03 DIAGNOSIS — R42 Dizziness and giddiness: Secondary | ICD-10-CM | POA: Diagnosis not present

## 2020-10-03 DIAGNOSIS — Z79899 Other long term (current) drug therapy: Secondary | ICD-10-CM | POA: Insufficient documentation

## 2020-10-03 DIAGNOSIS — F17210 Nicotine dependence, cigarettes, uncomplicated: Secondary | ICD-10-CM | POA: Insufficient documentation

## 2020-10-03 DIAGNOSIS — D649 Anemia, unspecified: Secondary | ICD-10-CM

## 2020-10-03 DIAGNOSIS — R0600 Dyspnea, unspecified: Secondary | ICD-10-CM | POA: Diagnosis not present

## 2020-10-03 DIAGNOSIS — R161 Splenomegaly, not elsewhere classified: Secondary | ICD-10-CM

## 2020-10-03 DIAGNOSIS — D696 Thrombocytopenia, unspecified: Secondary | ICD-10-CM | POA: Diagnosis not present

## 2020-10-03 DIAGNOSIS — Z7982 Long term (current) use of aspirin: Secondary | ICD-10-CM | POA: Diagnosis not present

## 2020-10-03 DIAGNOSIS — R0609 Other forms of dyspnea: Secondary | ICD-10-CM | POA: Insufficient documentation

## 2020-10-03 LAB — RETICULOCYTES
Immature Retic Fract: 6.1 % (ref 2.3–15.9)
RBC.: 4.37 MIL/uL (ref 4.22–5.81)
Retic Count, Absolute: 37.6 10*3/uL (ref 19.0–186.0)
Retic Ct Pct: 0.9 % (ref 0.4–3.1)

## 2020-10-03 LAB — LACTATE DEHYDROGENASE: LDH: 144 U/L (ref 98–192)

## 2020-10-03 LAB — CBC WITH DIFFERENTIAL/PLATELET
Abs Immature Granulocytes: 0 10*3/uL (ref 0.00–0.07)
Basophils Absolute: 0.6 10*3/uL — ABNORMAL HIGH (ref 0.0–0.1)
Basophils Relative: 5 %
Eosinophils Absolute: 0.1 10*3/uL (ref 0.0–0.5)
Eosinophils Relative: 1 %
HCT: 39.4 % (ref 39.0–52.0)
Hemoglobin: 13.2 g/dL (ref 13.0–17.0)
Lymphocytes Relative: 64 %
Lymphs Abs: 7.9 10*3/uL — ABNORMAL HIGH (ref 0.7–4.0)
MCH: 29.8 pg (ref 26.0–34.0)
MCHC: 33.5 g/dL (ref 30.0–36.0)
MCV: 88.9 fL (ref 80.0–100.0)
Monocytes Absolute: 0 10*3/uL — ABNORMAL LOW (ref 0.1–1.0)
Monocytes Relative: 0 %
Neutro Abs: 3.7 10*3/uL (ref 1.7–7.7)
Neutrophils Relative %: 30 %
Platelets: 132 10*3/uL — ABNORMAL LOW (ref 150–400)
RBC: 4.43 MIL/uL (ref 4.22–5.81)
RDW: 13.3 % (ref 11.5–15.5)
WBC: 12.4 10*3/uL — ABNORMAL HIGH (ref 4.0–10.5)
nRBC: 0 % (ref 0.0–0.2)

## 2020-10-03 LAB — COMPREHENSIVE METABOLIC PANEL
ALT: 19 U/L (ref 0–44)
AST: 26 U/L (ref 15–41)
Albumin: 4 g/dL (ref 3.5–5.0)
Alkaline Phosphatase: 82 U/L (ref 38–126)
Anion gap: 10 (ref 5–15)
BUN: 45 mg/dL — ABNORMAL HIGH (ref 8–23)
CO2: 26 mmol/L (ref 22–32)
Calcium: 9.6 mg/dL (ref 8.9–10.3)
Chloride: 99 mmol/L (ref 98–111)
Creatinine, Ser: 1.27 mg/dL — ABNORMAL HIGH (ref 0.61–1.24)
GFR, Estimated: 55 mL/min — ABNORMAL LOW (ref >60)
Glucose, Bld: 83 mg/dL (ref 70–99)
Potassium: 4.8 mmol/L (ref 3.5–5.1)
Sodium: 135 mmol/L (ref 135–145)
Total Bilirubin: 0.7 mg/dL (ref 0.3–1.2)
Total Protein: 7.2 g/dL (ref 6.5–8.1)

## 2020-10-03 LAB — IRON AND TIBC
Iron: 48 ug/dL (ref 45–182)
Saturation Ratios: 15 % — ABNORMAL LOW (ref 17.9–39.5)
TIBC: 328 ug/dL (ref 250–450)
UIBC: 280 ug/dL

## 2020-10-03 LAB — TECHNOLOGIST SMEAR REVIEW

## 2020-10-03 LAB — FERRITIN: Ferritin: 81 ng/mL (ref 24–336)

## 2020-10-03 LAB — HEPATITIS B SURFACE ANTIGEN: Hepatitis B Surface Ag: NONREACTIVE

## 2020-10-03 NOTE — Assessment & Plan Note (Addendum)
#   WBC- 22 [jan-March 2022];  Lymphocytosis-50% mild anemia ~12;/ mild thrombocytopenia [120]; given the weight loss-I am concerned that patient is symptomatic from possible lymphoproliferative disorder.  #Recommend further work-up including CBC CMP LDH; iron studies; myeloma panel; peripheral blood flow cytometry; hepatitis work-up.  I would also recommend ultrasound of the abdomen to rule out hepatosplenomegaly.  # Active smoker/COPD-given the dyspnea on exertion I would recommend chest x-ray.   #Counseled the patient daughter that given above findings concern for malignancy; however unclear if this is acute or chronic [suspicious for chronic than acute]-however no prior labs available [from [place]].  However.  Discussed that if this is a low-grade malignant process-surveillance is an option.  However await above work-up for treatment decisions.  Thank you, Ms.Stann Mainland NP for allowing me to participate in the care of your pleasant patient. Please do not hesitate to contact me with questions or concerns in the interim.  # DISPOSITION:mychart # CXR today # labs today- ordered # [place] abodmen ASAP # follow up MD- in appx 1 week- Dr.B

## 2020-10-03 NOTE — Progress Notes (Signed)
[place] NOTE  Patient Care Team: Gae Bon, NP as PCP - General (Nurse Practitioner)  CHIEF COMPLAINTS/PURPOSE OF CONSULTATION: ANEMIA   HEMATOLOGY HISTORY:  # ANEMIA WBC- 22 [JAN-MARCH 2022] lymphocytosis-50%]; Hb-12; platelets- 112' creat 1.0    # COPD at night O2; active smoker [1-1.5ppd]; CAD/ right bundle branch block, and peripheral vascular disease [Dr.Parashoes]  HISTORY OF PRESENTING ILLNESS:  Spencer Buck 87 y.o.  male has been referred to [place] for further evaluation/work-up for anemia/lymphocytosis/thrombocytopenia  Patient moved from [place] to live with his daughter about 6 months ago.   As per family patient noted to have weight loss 8-9 pounds in the last 6 months. Patient admits to good appetite. No night sweats.   Review of Systems  Constitutional: Positive for malaise/fatigue and weight loss. Negative for chills, diaphoresis and fever.  HENT: Negative for nosebleeds and sore throat.   Eyes: Negative for double vision.  Respiratory: Negative for cough, hemoptysis, sputum production, shortness of breath and wheezing.   Cardiovascular: Negative for chest pain, palpitations, orthopnea and leg swelling.  Gastrointestinal: Negative for abdominal pain, blood in stool, constipation, diarrhea, heartburn, melena, nausea and vomiting.  Genitourinary: Negative for dysuria, frequency and urgency.  Musculoskeletal: Positive for back pain and joint pain.  Skin: Negative.  Negative for itching and rash.  Neurological: Negative for dizziness, tingling, focal weakness, weakness and headaches.  Endo/Heme/Allergies: Does not bruise/bleed easily.  Psychiatric/Behavioral: Negative for depression. The patient is not nervous/anxious and does not have insomnia.     MEDICAL HISTORY:  Past Medical History:  Diagnosis Date  . Anemia   . BPH (benign prostatic hyperplasia)   . Cardiomegaly   . COPD (chronic obstructive pulmonary disease) ([place])   .  Diastolic heart failure ([place])   . Nicotine dependence, cigarettes, uncomplicated   . 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 Nonrheumatic aortic valve disorder   . PVD (peripheral vascular disease) ([place])   . Right bundle branch block   . Urinary retention     SURGICAL HISTORY: Past Surgical History:  Procedure Laterality Date  . HERNIA REPAIR    . KNEE SURGERY Left     SOCIAL HISTORY: Social History   Socioeconomic History  . Marital status: Married    Spouse name: Not on file  . Number of children: Not on file  . Years of education: Not on file  . Highest education level: Not on file  Occupational History  . Not on file  Tobacco Use  . Smoking status: Current Every Day Smoker    Packs/day: 1.50    Years: 72.00    Pack years: 108.00    Types: Cigarettes  . Smokeless tobacco: Never Used  Substance and Sexual Activity  . Alcohol use: Yes    Comment: 1/2 beer every night   . Drug use: Never  . Sexual activity: Yes  Other Topics Concern  . Not on file  Social History Narrative  . Not on file   Social Determinants of Health   Financial Resource Strain: Not on file  Food Insecurity: Not on file  Transportation Needs: Not on file  Physical Activity: Not on file  Stress: Not on file  Social Connections: Not on file  Intimate Partner Violence: Not on file    FAMILY HISTORY: Family History  Problem Relation Age of Onset  . Stroke Paternal Grandmother   . Glaucoma Other   . Diabetes Other     ALLERGIES:  has No  Known Allergies.  MEDICATIONS:  Current Outpatient Medications  Medication Sig Dispense Refill  . aspirin 81 MG chewable tablet Chew by mouth daily.    . Budeson-Glycopyrrol-Formoterol 160-9-4.8 MCG/ACT AERO Inhale 2 puffs into the lungs.    . cholecalciferol (VITAMIN D3) 25 MCG (1000 UNIT) tablet Take 1,000 Units by mouth daily.    . finasteride (PROSCAR) 5 MG tablet Take 5 mg by mouth daily.    . hydrochlorothiazide (MICROZIDE) 12.5  MG capsule Take 12.5 mg by mouth every other day.    Marland Kitchen MAGNESIUM GLYCINATE PO Take 500 mg by mouth daily.    . Multiple Vitamin (MULTIVITAMIN) tablet Take 1 tablet by mouth daily.    . tamsulosin (FLOMAX) 0.4 MG CAPS capsule Take 0.8 mg by mouth daily.    Marland Kitchen triamcinolone cream (KENALOG) 0.5 % APPLY A SMALL AMOUNT TWICE DAILY AS NEEDED     No current facility-administered medications for this visit.      PHYSICAL EXAMINATION:   Vitals:   10/03/20 1130  BP: (!) 142/91  Pulse: 83  Resp: 20  Temp: 97.8 F (36.6 C)  SpO2: 99%   Filed Weights   10/03/20 1130  Weight: 157 lb (71.2 kg)    Physical Exam Constitutional:      Comments: Elderly male patient ambulating independently.  Accompanied by his daughter  HENT:     Head: Normocephalic and atraumatic.     Mouth/Throat:     Pharynx: No oropharyngeal exudate.  Eyes:     Pupils: Pupils are equal, round, and reactive to light.  Cardiovascular:     Rate and Rhythm: Normal rate and regular rhythm.  Pulmonary:     Effort: No respiratory distress.     Breath sounds: No wheezing.     Comments: Decreased air entry bilaterally.  Scattered wheeze.  No crackles. Abdominal:     General: Bowel sounds are normal. There is no distension.     Palpations: Abdomen is soft. There is no mass.     Tenderness: There is no abdominal tenderness. There is no guarding or rebound.     Comments: No obvious splenomegaly noted.  Musculoskeletal:        General: No tenderness. Normal range of motion.     Cervical back: Normal range of motion and neck supple.  Skin:    General: Skin is warm.  Neurological:     Mental Status: He is alert and oriented to person, place, and time.  Psychiatric:        Mood and Affect: Affect normal.     LABORATORY DATA:  I have reviewed the data as listed Lab Results  Component Value Date   WBC 11.3 (H) 03/09/2020   HGB 12.6 (L) 03/09/2020   HCT 36.0 (L) 03/09/2020   MCV 83.9 03/09/2020   PLT 121 (L)  03/09/2020   Recent Labs    03/09/20 1433  NA 133*  K 4.9  CL 100  CO2 23  GLUCOSE 99  BUN 31*  CREATININE 1.93*  CALCIUM 9.1  GFRNONAA 30*  GFRAA 35*  PROT 7.0  ALBUMIN 4.4  AST 26  ALT 16  ALKPHOS 79  BILITOT 0.7     No results found.  Lymphocytosis # WBC- 22 [jan-March 2022];  Lymphocytosis-50% mild anemia ~12;/ mild thrombocytopenia [120]; given the weight loss-I am concerned that patient is symptomatic from possible lymphoproliferative disorder.  #Recommend further work-up including CBC CMP LDH; iron studies; myeloma panel; peripheral blood flow cytometry; hepatitis work-up.  I would also  recommend ultrasound of the abdomen to rule out hepatosplenomegaly.  # Active smoker/COPD-given the dyspnea on exertion I would recommend chest x-ray.   #Counseled the patient daughter that given above findings concern for malignancy; however unclear if this is acute or chronic [suspicious for chronic than acute]-however no prior labs available [from [place]].  However.  Discussed that if this is a low-grade malignant process-surveillance is an option.  However await above work-up for treatment decisions.  Thank you, Ms.Stann Mainland NP for allowing me to participate in the care of your pleasant patient. Please do not hesitate to contact me with questions or concerns in the interim.  # DISPOSITION:mychart # CXR today # labs today- ordered # [place] abodmen ASAP # follow up MD- in appx 1 week- Dr.B  All questions were answered. The patient knows to call the clinic with any problems, questions or concerns.   Cammie Sickle, MD 10/03/2020 12:50 PM

## 2020-10-04 ENCOUNTER — Ambulatory Visit
Admission: RE | Admit: 2020-10-04 | Discharge: 2020-10-04 | Disposition: A | Discharge status: Home / Self Care | Payer: Medicare PPO | Source: Home / Self Care | Attending: Internal Medicine | Admitting: Internal Medicine

## 2020-10-04 ENCOUNTER — Ambulatory Visit
Admission: RE | Admit: 2020-10-04 | Discharge: 2020-10-04 | Disposition: A | Discharge status: Home / Self Care | Payer: Medicare PPO | Source: Ambulatory Visit | Attending: Internal Medicine | Admitting: Internal Medicine

## 2020-10-04 ENCOUNTER — Other Ambulatory Visit: Payer: Self-pay

## 2020-10-04 DIAGNOSIS — R0600 Dyspnea, unspecified: Secondary | ICD-10-CM

## 2020-10-04 DIAGNOSIS — D72820 Lymphocytosis (symptomatic): Secondary | ICD-10-CM | POA: Diagnosis not present

## 2020-10-04 DIAGNOSIS — R161 Splenomegaly, not elsewhere classified: Secondary | ICD-10-CM | POA: Insufficient documentation

## 2020-10-04 DIAGNOSIS — R0609 Other forms of dyspnea: Secondary | ICD-10-CM

## 2020-10-04 LAB — KAPPA/LAMBDA LIGHT CHAINS
Kappa free light chain: 27.4 mg/L — ABNORMAL HIGH (ref 3.3–19.4)
Kappa, lambda light chain ratio: 0.74 (ref 0.26–1.65)
Lambda free light chains: 37.1 mg/L — ABNORMAL HIGH (ref 5.7–26.3)

## 2020-10-04 LAB — HEPATITIS C ANTIBODY: HCV Ab: NONREACTIVE

## 2020-10-04 LAB — HEPATITIS B CORE ANTIBODY, IGM: Hep B C IgM: NONREACTIVE

## 2020-10-07 LAB — IMMUNOFIXATION ELECTROPHORESIS
IgA: 28 mg/dL — ABNORMAL LOW (ref 61–437)
IgG (Immunoglobin G), Serum: 1008 mg/dL (ref 603–1613)
IgM (Immunoglobulin M), Srm: 42 mg/dL (ref 15–143)
Total Protein ELP: 6.6 g/dL (ref 6.0–8.5)

## 2020-10-08 LAB — COMP PANEL: LEUKEMIA/LYMPHOMA: Immunophenotypic Profile: 38

## 2020-10-10 ENCOUNTER — Inpatient Hospital Stay: Payer: Medicare PPO | Admitting: Internal Medicine

## 2020-10-10 ENCOUNTER — Inpatient Hospital Stay: Payer: Medicare PPO

## 2020-10-10 ENCOUNTER — Encounter: Payer: Self-pay | Admitting: Internal Medicine

## 2020-10-10 ENCOUNTER — Other Ambulatory Visit: Payer: Self-pay

## 2020-10-10 VITALS — BP 111/56 | HR 79 | Resp 26

## 2020-10-10 DIAGNOSIS — C9110 Chronic lymphocytic leukemia of B-cell type not having achieved remission: Secondary | ICD-10-CM | POA: Diagnosis not present

## 2020-10-10 DIAGNOSIS — I959 Hypotension, unspecified: Secondary | ICD-10-CM

## 2020-10-10 DIAGNOSIS — D649 Anemia, unspecified: Secondary | ICD-10-CM | POA: Diagnosis not present

## 2020-10-10 DIAGNOSIS — E860 Dehydration: Secondary | ICD-10-CM

## 2020-10-10 MED ORDER — SODIUM CHLORIDE 0.9 % IV SOLN
INTRAVENOUS | Status: DC
  Filled 2020-10-10 (×2): qty 250

## 2020-10-10 MED ORDER — PREDNISONE 20 MG PO TABS: 40.0000 mg | ORAL_TABLET | Freq: Every day | ORAL | 0 refills | Status: DC

## 2020-10-10 MED ORDER — DEXAMETHASONE SODIUM PHOSPHATE 10 MG/ML IJ SOLN
8.0000 mg | Freq: Once | INTRAMUSCULAR | Status: AC
  Administered 2020-10-10: 8 mg via INTRAVENOUS
  Filled 2020-10-10: qty 1

## 2020-10-10 NOTE — Patient Instructions (Signed)
#   check BP at home; and call us if low [less than 90]

## 2020-10-10 NOTE — Assessment & Plan Note (Addendum)
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  Ultrasound shows -splenomegaly  #Had a long discussion with patient/daughter regarding concern for [name]-however I am not sure if the minimal burden of disease noted on peripheral blood count is causing patient's symptoms-weight loss extreme fatigue.  I would recommend further evaluation with a PET scan especially splenomegaly-rule out small cell lymphoma/other lymphoproliferative disorder.  # Hypotension systolic blood pressure 16X; mildly symptomatic.  Likely dehydration/poor p.o. intake recommend IV fluids.  Also recommend steroids IV x1.  No concerns for any sepsis.  Recommend close monitoring of blood pressures at home.  Call us if running low.  Prednisone as below.  #Weight loss/fatigue-await above work-up; plan prednisone 40 mg a day for the next 2 weeks  #Acute on chronic bronchitis/emphysema-chest x-ray April 2022 negative for any acute process.  Hold off antibiotics given poor tolerance.  Prednisone as above  # DISPOSITION: # PET ASAP- # IVFs today over 1 hour # follow up in 10days- MD; labs- cbc/bmp- Dr.B

## 2020-10-10 NOTE — Progress Notes (Signed)
[facility] NOTE  Patient Care Team: Gae Bon, NP as PCP - General (Nurse Practitioner)  CHIEF COMPLAINTS/PURPOSE OF CONSULTATION: ANEMIA   HEMATOLOGY HISTORY:  # April 2022- [name] [phenotype flow cytometry]; US-splenomegaly mild.  Absolute neutrophil count 7000; hemoglobin 13; platelets 120s.   # COPD at night O2; active smoker [1-1.5ppd]; CAD/ right bundle branch block, and peripheral vascular disease [Dr.Parashoes]  HISTORY OF PRESENTING ILLNESS:  Spencer Buck 87 y.o.  male history of lymphocytosis/thrombocytopenia is here today with results of his blood work/ and next plan of care.  Patient has a good appetite.  He continues to lose weight.   Patient was recently diagnosed by his PCP with bronchitis started on Augmentin.  Patient had nausea with vomiting.  Did not eat well overall yesterday.  Was feeling dizzy this morning.  Otherwise denies any chest pain or shortness of breath or cough.  No fevers or chills.  Review of Systems  Constitutional: Positive for malaise/fatigue and weight loss. Negative for chills, diaphoresis and fever.  HENT: Negative for nosebleeds and sore throat.   Eyes: Negative for double vision.  Respiratory: Negative for cough, hemoptysis, sputum production, shortness of breath and wheezing.   Cardiovascular: Negative for chest pain, palpitations, orthopnea and leg swelling.  Gastrointestinal: Negative for abdominal pain, blood in stool, constipation, diarrhea, heartburn, melena, nausea and vomiting.  Genitourinary: Negative for dysuria, frequency and urgency.  Musculoskeletal: Positive for back pain and joint pain.  Skin: Negative.  Negative for itching and rash.  Neurological: Negative for dizziness, tingling, focal weakness, weakness and headaches.  Endo/Heme/Allergies: Does not bruise/bleed easily.  Psychiatric/Behavioral: Negative for depression. The patient is not nervous/anxious and does not have insomnia.      MEDICAL HISTORY:  Past Medical History:  Diagnosis Date  . Anemia   . BPH (benign prostatic hyperplasia)   . Cardiomegaly   . COPD (chronic obstructive pulmonary disease) ([place])   . Diastolic heart failure ([place])   . Nicotine dependence, cigarettes, uncomplicated   . 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 Nonrheumatic aortic valve disorder   . PVD (peripheral vascular disease) ([place])   . Right bundle branch block   . Urinary retention     SURGICAL HISTORY: Past Surgical History:  Procedure Laterality Date  . HERNIA REPAIR    . KNEE SURGERY Left     SOCIAL HISTORY: Social History   Socioeconomic History  . Marital status: Married    Spouse name: Not on file  . Number of children: Not on file  . Years of education: Not on file  . Highest education level: Not on file  Occupational History  . Not on file  Tobacco Use  . Smoking status: Current Every Day Smoker    Packs/day: 1.50    Years: 72.00    Pack years: 108.00    Types: Cigarettes  . Smokeless tobacco: Never Used  Substance and Sexual Activity  . Alcohol use: Yes    Comment: 1/2 beer every night   . Drug use: Never  . Sexual activity: Yes  Other Topics Concern  . Not on file  Social History Narrative  . Not on file   Social Determinants of Health   Financial Resource Strain: Not on file  Food Insecurity: Not on file  Transportation Needs: Not on file  Physical Activity: Not on file  Stress: Not on file  Social Connections: Not on file  Intimate Partner Violence: Not on file  FAMILY HISTORY: Family History  Problem Relation Age of Onset  . Stroke Paternal Grandmother   . Glaucoma Other   . Diabetes Other     ALLERGIES:  has No Known Allergies.  MEDICATIONS:  Current Outpatient Medications  Medication Sig Dispense Refill  . amoxicillin-clavulanate (AUGMENTIN) 500-125 MG tablet Take 1 tablet by mouth 3 (three) times daily.    Marland Kitchen aspirin 81 MG chewable tablet Chew by mouth  daily.    . benzonatate (TESSALON) 200 MG capsule Take 200 mg by mouth 3 (three) times daily as needed for cough.    . Budeson-Glycopyrrol-Formoterol 160-9-4.8 MCG/ACT AERO Inhale 2 puffs into the lungs.    . cholecalciferol (VITAMIN D3) 25 MCG (1000 UNIT) tablet Take 1,000 Units by mouth daily.    . finasteride (PROSCAR) 5 MG tablet Take 5 mg by mouth daily.    Marland Kitchen MAGNESIUM GLYCINATE PO Take 500 mg by mouth daily.    . Multiple Vitamin (MULTIVITAMIN) tablet Take 1 tablet by mouth daily.    . predniSONE (DELTASONE) 20 MG tablet Take 2 tablets (40 mg total) by mouth daily with breakfast. Once a day with food- in AM. 30 tablet 0  . tamsulosin (FLOMAX) 0.4 MG CAPS capsule Take 0.8 mg by mouth daily.    Marland Kitchen triamcinolone cream (KENALOG) 0.5 % APPLY A SMALL AMOUNT TWICE DAILY AS NEEDED    . hydrochlorothiazide (MICROZIDE) 12.5 MG capsule Take 12.5 mg by mouth every other day. (Patient not taking: Reported on 10/10/2020)     No current facility-administered medications for this visit.   Facility-Administered Medications Ordered in Other Visits  Medication Dose Route Frequency Provider Last Rate Last Admin  . 0.9 %  sodium chloride infusion   Intravenous Continuous Cammie Sickle, MD   Stopped at 10/10/20 1333      PHYSICAL EXAMINATION:   Vitals:   10/10/20 1041  BP: (!) 81/46  Pulse: 77  Resp: (!) 24  Temp: 97.6 F (36.4 C)  SpO2: 100%   Filed Weights   10/10/20 1041  Weight: 143 lb 3.2 oz (65 kg)    Physical Exam Constitutional:      Comments: Elderly male patient ambulating independently.  Accompanied by his daughter  HENT:     Head: Normocephalic and atraumatic.     Mouth/Throat:     Pharynx: No oropharyngeal exudate.  Eyes:     Pupils: Pupils are equal, round, and reactive to light.  Cardiovascular:     Rate and Rhythm: Normal rate and regular rhythm.  Pulmonary:     Effort: No respiratory distress.     Breath sounds: No wheezing.     Comments: Decreased air entry  bilaterally.  Scattered wheeze.  No crackles. Abdominal:     General: Bowel sounds are normal. There is no distension.     Palpations: Abdomen is soft. There is no mass.     Tenderness: There is no abdominal tenderness. There is no guarding or rebound.     Comments: No obvious splenomegaly noted.  Musculoskeletal:        General: No tenderness. Normal range of motion.     Cervical back: Normal range of motion and neck supple.  Skin:    General: Skin is warm.  Neurological:     Mental Status: He is alert and oriented to person, place, and time.  Psychiatric:        Mood and Affect: Affect normal.     LABORATORY DATA:  I have reviewed the data as listed  Lab Results  Component Value Date   WBC 12.4 (H) 10/03/2020   HGB 13.2 10/03/2020   HCT 39.4 10/03/2020   MCV 88.9 10/03/2020   PLT 132 (L) 10/03/2020   Recent Labs    03/09/20 1433 10/03/20 1225  NA 133* 135  K 4.9 4.8  CL 100 99  CO2 23 26  GLUCOSE 99 83  BUN 31* 45*  CREATININE 1.93* 1.27*  CALCIUM 9.1 9.6  GFRNONAA 30* 55*  GFRAA 35*  --   PROT 7.0 7.2  ALBUMIN 4.4 4.0  AST 26 26  ALT 16 19  ALKPHOS 79 82  BILITOT 0.7 0.7     DG Chest 1 View  Result Date: 10/06/2020 CLINICAL DATA:  87 year old male with shortness of breath. COPD. EXAM: CHEST  1 VIEW COMPARISON:  None. FINDINGS: There is background of emphysema. No focal consolidation, pleural effusion, or pneumothorax. The cardiac silhouette is within limits. Atherosclerotic calcification of the aorta. There is degenerative changes of the spine with dextroscoliosis. No acute osseous pathology. IMPRESSION: 1. No active disease. 2. Aortic Atherosclerosis (ICD10-I70.0) and Emphysema (ICD10-J43.9). Electronically Signed   By: Anner Crete M.D.   On: 10/06/2020 13:05   US Abdomen Limited  Result Date: 10/05/2020 CLINICAL DATA:  Leukocytosis EXAM: ULTRASOUND ABDOMEN LIMITED COMPARISON:  None. FINDINGS: The spleen is enlarged measuring 13.2 cm in longest  dimension and volume of 864 mL. Scattered tiny splenic calcifications likely result of prior granulomatous inflammation. IMPRESSION: Splenomegaly with volume measuring 864 mL Electronically Signed   By: Miachel Roux M.D.   On: 10/05/2020 10:30    [name] (chronic lymphocytic leukemia) (HCC) #[name] [phenotype based on flow cytometry]; absolute neutrophil count 7000; white count 12,000 hemoglobin 13; platelets 120s.  Ultrasound shows -splenomegaly  #Had a long discussion with patient/daughter regarding concern for [name]-however I am not sure if the minimal burden of disease noted on peripheral blood count is causing patient's symptoms-weight loss extreme fatigue.  I would recommend further evaluation with a PET scan especially splenomegaly-rule out small cell lymphoma/other lymphoproliferative disorder.  # Hypotension systolic blood pressure 85I; mildly symptomatic.  Likely dehydration/poor p.o. intake recommend IV fluids.  Also recommend steroids IV x1.  No concerns for any sepsis.  Recommend close monitoring of blood pressures at home.  Call us if running low.  Prednisone as below.  #Weight loss/fatigue-await above work-up; plan prednisone 40 mg a day for the next 2 weeks  #Acute on chronic bronchitis/emphysema-chest x-ray April 2022 negative for any acute process.  Hold off antibiotics given poor tolerance.  Prednisone as above  # DISPOSITION: # PET ASAP- # IVFs today over 1 hour # follow up in 10days- MD; labs- cbc/bmp- Dr.B  All questions were answered. The patient knows to call the clinic with any problems, questions or concerns.   Cammie Sickle, MD 10/10/2020 2:16 PM

## 2020-10-10 NOTE — Progress Notes (Signed)
patient has been treated for URI/cough. started augmentin 500-125. pt unable to tolerate antibiotics due to one episode of nausea. patient reports one epsidoe of diarrhea. Patient has been very weak per daughter. Patient has not had any falls in the home.

## 2020-10-17 ENCOUNTER — Other Ambulatory Visit: Payer: Self-pay

## 2020-10-17 ENCOUNTER — Ambulatory Visit
Admission: RE | Admit: 2020-10-17 | Discharge: 2020-10-17 | Disposition: A | Discharge status: Home / Self Care | Payer: Medicare PPO | Source: Ambulatory Visit | Attending: Internal Medicine | Admitting: Internal Medicine

## 2020-10-17 DIAGNOSIS — C9110 Chronic lymphocytic leukemia of B-cell type not having achieved remission: Secondary | ICD-10-CM | POA: Insufficient documentation

## 2020-10-17 DIAGNOSIS — I2510 Atherosclerotic heart disease of native coronary artery without angina pectoris: Secondary | ICD-10-CM | POA: Insufficient documentation

## 2020-10-17 DIAGNOSIS — R161 Splenomegaly, not elsewhere classified: Secondary | ICD-10-CM | POA: Diagnosis not present

## 2020-10-17 DIAGNOSIS — I700 Atherosclerosis of aorta: Secondary | ICD-10-CM | POA: Insufficient documentation

## 2020-10-17 LAB — GLUCOSE, CAPILLARY: Glucose-Capillary: 80 mg/dL (ref 70–99)

## 2020-10-17 MED ORDER — FLUDEOXYGLUCOSE F - 18 (FDG) INJECTION
7.4000 | Freq: Once | INTRAVENOUS | Status: AC | PRN
  Administered 2020-10-17: 7.45 via INTRAVENOUS

## 2020-10-21 ENCOUNTER — Inpatient Hospital Stay: Payer: Medicare PPO | Attending: Internal Medicine

## 2020-10-21 ENCOUNTER — Inpatient Hospital Stay: Payer: Medicare PPO | Admitting: Internal Medicine

## 2020-10-21 ENCOUNTER — Encounter: Payer: Self-pay | Admitting: Internal Medicine

## 2020-10-21 DIAGNOSIS — Z79899 Other long term (current) drug therapy: Secondary | ICD-10-CM | POA: Diagnosis not present

## 2020-10-21 DIAGNOSIS — Z7952 Long term (current) use of systemic steroids: Secondary | ICD-10-CM | POA: Insufficient documentation

## 2020-10-21 DIAGNOSIS — F17210 Nicotine dependence, cigarettes, uncomplicated: Secondary | ICD-10-CM | POA: Insufficient documentation

## 2020-10-21 DIAGNOSIS — Z7982 Long term (current) use of aspirin: Secondary | ICD-10-CM | POA: Diagnosis not present

## 2020-10-21 DIAGNOSIS — C9110 Chronic lymphocytic leukemia of B-cell type not having achieved remission: Secondary | ICD-10-CM

## 2020-10-21 DIAGNOSIS — J449 Chronic obstructive pulmonary disease, unspecified: Secondary | ICD-10-CM | POA: Insufficient documentation

## 2020-10-21 DIAGNOSIS — N400 Enlarged prostate without lower urinary tract symptoms: Secondary | ICD-10-CM | POA: Diagnosis not present

## 2020-10-21 LAB — CBC WITH DIFFERENTIAL/PLATELET
Abs Immature Granulocytes: 0.08 10*3/uL — ABNORMAL HIGH (ref 0.00–0.07)
Basophils Absolute: 0 10*3/uL (ref 0.0–0.1)
Basophils Relative: 0 %
Eosinophils Absolute: 0.1 10*3/uL (ref 0.0–0.5)
Eosinophils Relative: 1 %
HCT: 37.6 % — ABNORMAL LOW (ref 39.0–52.0)
Hemoglobin: 12 g/dL — ABNORMAL LOW (ref 13.0–17.0)
Immature Granulocytes: 0 %
Lymphocytes Relative: 53 %
Lymphs Abs: 10 10*3/uL — ABNORMAL HIGH (ref 0.7–4.0)
MCH: 28.8 pg (ref 26.0–34.0)
MCHC: 31.9 g/dL (ref 30.0–36.0)
MCV: 90.4 fL (ref 80.0–100.0)
Monocytes Absolute: 1.8 10*3/uL — ABNORMAL HIGH (ref 0.1–1.0)
Monocytes Relative: 9 %
Neutro Abs: 7 10*3/uL (ref 1.7–7.7)
Neutrophils Relative %: 37 %
Platelets: 156 10*3/uL (ref 150–400)
RBC: 4.16 MIL/uL — ABNORMAL LOW (ref 4.22–5.81)
RDW: 13.7 % (ref 11.5–15.5)
Smear Review: NORMAL
WBC Morphology: ABNORMAL
WBC: 19 10*3/uL — ABNORMAL HIGH (ref 4.0–10.5)
nRBC: 0 % (ref 0.0–0.2)

## 2020-10-21 LAB — BASIC METABOLIC PANEL
Anion gap: 6 (ref 5–15)
BUN: 32 mg/dL — ABNORMAL HIGH (ref 8–23)
CO2: 32 mmol/L (ref 22–32)
Calcium: 8.8 mg/dL — ABNORMAL LOW (ref 8.9–10.3)
Chloride: 100 mmol/L (ref 98–111)
Creatinine, Ser: 1.17 mg/dL (ref 0.61–1.24)
GFR, Estimated: >60 mL/min (ref >60)
Glucose, Bld: 90 mg/dL (ref 70–99)
Potassium: 4 mmol/L (ref 3.5–5.1)
Sodium: 138 mmol/L (ref 135–145)

## 2020-10-21 NOTE — Assessment & Plan Note (Addendum)
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 PET scan- May 2022-no evidence of any lymphadenopathy-suggestive of active [name].Marland Kitchen  Borderline enlarged spleen/normal metabolic activity; see discussion regarding left kidney [see below].   #Had a long discussion with patient/daughter that given his low burden of disease-I do not suspect [name] as a major factor in contributing to patient's symptoms of fatigue/weight loss etc.  I would suspect patient's symptoms are related to-either his underlying COPD/or other causes.  From [name] standpoint I would recommend surveillance.   # LEFT Renal obstruction- incidental on PET [straight cath once a week; Dr.Stoioff; defer to urology for further work-up/recommendations.  I have sent a message to Dr. Faythe Ghee; also recommend the daughter call urology for further recommendations.  # chronic bronchitis/emphysema-chest x-ray April 2022 negative for any acute process; however patient not using nebulizer/because of cost issues.  Recommend pulmmonary referral.   #Weight loss/ongoing fatigue-suspect COPD/other causes-defer to PCP for further work-up.  Symptoms not related to [name].  Taper prednisone to 20 mg a day for the next 1 week or so and then stop.  # DISPOSITION: # follow up in  6 months-  MD; labs- cbc/cmp- Dr.B  Cc: Ms.Rogers, NP

## 2020-10-21 NOTE — Progress Notes (Signed)
[facility] NOTE  Patient Care Team: Gae Bon, NP as PCP - General (Nurse Practitioner)  CHIEF COMPLAINTS/PURPOSE OF CONSULTATION: ANEMIA  Oncology History Overview Note  #April-MAY 2022-[name] [phenotype based on flow cytometry]; absolute neutrophil count 7000; white count 12,000 hemoglobin 13; platelets 120s. PET scan- May 2022-no evidence of any lymphadenopathy-suggestive of active [name].Marland Kitchen  Borderline enlarged spleen/normal metabolic activity; see discussion regarding left kidney [see below].   # COPD/active smoker; CAD; hx of urinary obstruction [Dr.Stoioff]; ? Left kidney UPJ obstruction ----------------------------------  HEMATOLOGY HISTORY:  # April 2022- [name] [phenotype flow cytometry]; US-splenomegaly mild.  Absolute neutrophil count 7000; hemoglobin 13; platelets 120s.   # COPD at night O2; active smoker [1-1.5ppd]; CAD/ right bundle branch block, and peripheral vascular disease [Dr.Parashoes]   [name] (chronic lymphocytic leukemia) ([place])  10/10/2020 Initial Diagnosis   [name] (chronic lymphocytic leukemia) ([place])   10/22/2020 Cancer Staging   Staging form: Chronic Lymphocytic Leukemia / Small Lymphocytic Lymphoma, AJCC 8th Edition - Clinical: Modified Rai Stage II (Modified Rai risk: Intermediate, Lymphocytosis: Present, Adenopathy: Absent, Organomegaly: Present, Anemia: Absent, Thrombocytopenia: Absent) - Signed by Cammie Sickle, MD on 10/22/2020 Stage prefix: Initial diagnosis      HISTORY OF PRESENTING ILLNESS:  Spencer Buck 87 y.o.  male recently diagnosed [name] is here for follow-up/review results of the PET scan.  Patient complains of good appetite.  However complains of weight loss.  Is currently on prednisone-notes improvement of his fatigue/and also eating better.  Denies any worsening shortness of breath or chest pain.  Denies any fevers or chills.  Review of Systems  Constitutional: Positive for malaise/fatigue and weight loss.  Negative for chills, diaphoresis and fever.  HENT: Negative for nosebleeds and sore throat.   Eyes: Negative for double vision.  Respiratory: Negative for cough, hemoptysis, sputum production, shortness of breath and wheezing.   Cardiovascular: Negative for chest pain, palpitations, orthopnea and leg swelling.  Gastrointestinal: Negative for abdominal pain, blood in stool, constipation, diarrhea, heartburn, melena, nausea and vomiting.  Genitourinary: Negative for dysuria, frequency and urgency.  Musculoskeletal: Positive for back pain and joint pain.  Skin: Negative.  Negative for itching and rash.  Neurological: Negative for dizziness, tingling, focal weakness, weakness and headaches.  Endo/Heme/Allergies: Does not bruise/bleed easily.  Psychiatric/Behavioral: Negative for depression. The patient is not nervous/anxious and does not have insomnia.     MEDICAL HISTORY:  Past Medical History:  Diagnosis Date  . Anemia   . BPH (benign prostatic hyperplasia)   . Cardiomegaly   . COPD (chronic obstructive pulmonary disease) ([place])   . Diastolic heart failure ([place])   . Nicotine dependence, cigarettes, uncomplicated   . 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 Nonrheumatic aortic valve disorder   . PVD (peripheral vascular disease) ([place])   . Right bundle branch block   . Urinary retention     SURGICAL HISTORY: Past Surgical History:  Procedure Laterality Date  . HERNIA REPAIR    . KNEE SURGERY Left     SOCIAL HISTORY: Social History   Socioeconomic History  . Marital status: Married    Spouse name: Not on file  . Number of children: Not on file  . Years of education: Not on file  . Highest education level: Not on file  Occupational History  . Not on file  Tobacco Use  . Smoking status: Current Every Day Smoker    Packs/day: 1.50    Years: 72.00    Pack years: 108.00  Types: Cigarettes  . Smokeless tobacco: Never Used  Substance and Sexual Activity  .  Alcohol use: Yes    Comment: 1/2 beer every night   . Drug use: Never  . Sexual activity: Yes  Other Topics Concern  . Not on file  Social History Narrative  . Not on file   Social Determinants of Health   Financial Resource Strain: Not on file  Food Insecurity: Not on file  Transportation Needs: Not on file  Physical Activity: Not on file  Stress: Not on file  Social Connections: Not on file  Intimate Partner Violence: Not on file    FAMILY HISTORY: Family History  Problem Relation Age of Onset  . Stroke Paternal Grandmother   . Glaucoma Other   . Diabetes Other     ALLERGIES:  has No Known Allergies.  MEDICATIONS:  Current Outpatient Medications  Medication Sig Dispense Refill  . aspirin 81 MG chewable tablet Chew by mouth daily.    . benzonatate (TESSALON) 200 MG capsule Take 200 mg by mouth 3 (three) times daily as needed for cough.    . Budeson-Glycopyrrol-Formoterol 160-9-4.8 MCG/ACT AERO Inhale 2 puffs into the lungs.    . cholecalciferol (VITAMIN D3) 25 MCG (1000 UNIT) tablet Take 1,000 Units by mouth daily.    . finasteride (PROSCAR) 5 MG tablet Take 5 mg by mouth daily.    Marland Kitchen MAGNESIUM GLYCINATE PO Take 500 mg by mouth daily.    . Multiple Vitamin (MULTIVITAMIN) tablet Take 1 tablet by mouth daily.    . predniSONE (DELTASONE) 20 MG tablet Take 2 tablets (40 mg total) by mouth daily with breakfast. Once a day with food- in AM. 30 tablet 0  . tamsulosin (FLOMAX) 0.4 MG CAPS capsule Take 0.8 mg by mouth daily.    Marland Kitchen triamcinolone cream (KENALOG) 0.5 % APPLY A SMALL AMOUNT TWICE DAILY AS NEEDED    . hydrochlorothiazide (MICROZIDE) 12.5 MG capsule Take 12.5 mg by mouth every other day. (Patient not taking: No sig reported)     No current facility-administered medications for this visit.      PHYSICAL EXAMINATION:   Vitals:   10/21/20 0952  BP: 108/64  Pulse: 66  Resp: 16  Temp: (!) 97 F (36.1 C)  SpO2: 100%   Filed Weights   10/21/20 0952  Weight:  151 lb (68.5 kg)    Physical Exam Constitutional:      Comments: Elderly male patient ambulating independently.  Accompanied by his daughter  HENT:     Head: Normocephalic and atraumatic.     Mouth/Throat:     Pharynx: No oropharyngeal exudate.  Eyes:     Pupils: Pupils are equal, round, and reactive to light.  Cardiovascular:     Rate and Rhythm: Normal rate and regular rhythm.  Pulmonary:     Effort: No respiratory distress.     Breath sounds: No wheezing.     Comments: Decreased air entry bilaterally.   Abdominal:     General: Bowel sounds are normal. There is no distension.     Palpations: Abdomen is soft. There is no mass.     Tenderness: There is no abdominal tenderness. There is no guarding or rebound.     Comments: No obvious splenomegaly noted.  Musculoskeletal:        General: No tenderness. Normal range of motion.     Cervical back: Normal range of motion and neck supple.  Skin:    General: Skin is warm.  Neurological:  Mental Status: He is alert and oriented to person, place, and time.  Psychiatric:        Mood and Affect: Affect normal.     LABORATORY DATA:  I have reviewed the data as listed Lab Results  Component Value Date   WBC 19.0 (H) 10/21/2020   HGB 12.0 (L) 10/21/2020   HCT 37.6 (L) 10/21/2020   MCV 90.4 10/21/2020   PLT 156 10/21/2020   Recent Labs    03/09/20 1433 10/03/20 1225 10/21/20 0936  NA 133* 135 138  K 4.9 4.8 4.0  CL 100 99 100  CO2 23 26 32  GLUCOSE 99 83 90  BUN 31* 45* 32*  CREATININE 1.93* 1.27* 1.17  CALCIUM 9.1 9.6 8.8*  GFRNONAA 30* 55* >60  GFRAA 35*  --   --   PROT 7.0 7.2  --   ALBUMIN 4.4 4.0  --   AST 26 26  --   ALT 16 19  --   ALKPHOS 79 82  --   BILITOT 0.7 0.7  --      DG Chest 1 View  Result Date: 10/06/2020 CLINICAL DATA:  87 year old male with shortness of breath. COPD. EXAM: CHEST  1 VIEW COMPARISON:  None. FINDINGS: There is background of emphysema. No focal consolidation, pleural  effusion, or pneumothorax. The cardiac silhouette is within limits. Atherosclerotic calcification of the aorta. There is degenerative changes of the spine with dextroscoliosis. No acute osseous pathology. IMPRESSION: 1. No active disease. 2. Aortic Atherosclerosis (ICD10-I70.0) and Emphysema (ICD10-J43.9). Electronically Signed   By: Anner Crete M.D.   On: 10/06/2020 13:05   NM PET Image Initial (PI) Skull Base To Thigh  Result Date: 10/18/2020 CLINICAL DATA:  Initial treatment strategy for chronic lymphocytic leukemia. EXAM: NUCLEAR MEDICINE PET SKULL BASE TO THIGH TECHNIQUE: 7.45 mCi F-18 FDG was injected intravenously. Full-ring PET imaging was performed from the skull base to thigh after the radiotracer. CT data was obtained and used for attenuation correction and anatomic localization. Fasting blood glucose: 80 mg/dl COMPARISON:  Spleen ultrasound 10/04/2020 FINDINGS: Mediastinal blood pool activity: SUV max 1.42 Liver activity: SUV max 2.44 NECK: No hypermetabolic lymph nodes in the neck. Incidental CT findings: none CHEST: No hypermetabolic mediastinal or hilar nodes. No suspicious pulmonary nodules on the CT scan. Incidental CT findings: Coronary artery calcification and aortic atherosclerotic calcification. Paraseptal emphysema. Subpleural cystic change in the LEFT lower lobe. ABDOMEN/PELVIS: No abnormal hypermetabolic activity within the liver, pancreas, adrenal glands, or spleen. No hypermetabolic lymph nodes in the abdomen or pelvis. Spleen is upper limits of normal measuring 14.0 x 5.8 by 10.7 cm (volume = 450 cm^3). No abnormal metabolic activity. There is hydronephrosis of the LEFT kidney with dilated LEFT renal pelvis. No obstructing lesion identified. Findings are suggestive of UPJ obstruction. Incidental CT findings: Prostate hypertrophy. No abdominal or pelvic lymphadenopathy identified. SKELETON: No focal hypermetabolic activity to suggest skeletal metastasis. Incidental CT findings: none  IMPRESSION: 1. No clear evidence of chronic lymphocytic leukemia. No evidence of active lymphoma on FDG PET scan. No enlarged lymph nodes. 2. Borderline enlarged spleen with normal metabolic activity. 3. Suggestion of UPJ obstruction on the LEFT. Consider CT urogram for further evaluation. 4. Coronary artery calcification and Aortic Atherosclerosis (ICD10-I70.0). Electronically Signed   By: Suzy Bouchard M.D.   On: 10/18/2020 09:07   US Abdomen Limited  Result Date: 10/05/2020 CLINICAL DATA:  Leukocytosis EXAM: ULTRASOUND ABDOMEN LIMITED COMPARISON:  None. FINDINGS: The spleen is enlarged measuring 13.2 cm in longest  dimension and volume of 864 mL. Scattered tiny splenic calcifications likely result of prior granulomatous inflammation. IMPRESSION: Splenomegaly with volume measuring 864 mL Electronically Signed   By: Miachel Roux M.D.   On: 10/05/2020 10:30    [name] (chronic lymphocytic leukemia) (HCC) #[name] [phenotype based on flow cytometry]; absolute neutrophil count 7000; white count 12,000 hemoglobin 13; platelets 120s. PET scan- May 2022-no evidence of any lymphadenopathy-suggestive of active [name].Marland Kitchen  Borderline enlarged spleen/normal metabolic activity; see discussion regarding left kidney [see below].   #Had a long discussion with patient/daughter that given his low burden of disease-I do not suspect [name] as a major factor in contributing to patient's symptoms of fatigue/weight loss etc.  I would suspect patient's symptoms are related to-either his underlying COPD/or other causes.  From [name] standpoint I would recommend surveillance.   # LEFT Renal obstruction- incidental on PET [straight cath once a week; Dr.Stoioff; defer to urology for further work-up/recommendations.  I have sent a message to Dr. Faythe Ghee; also recommend the daughter call urology for further recommendations.  # chronic bronchitis/emphysema-chest x-ray April 2022 negative for any acute process; however patient not using  nebulizer/because of cost issues.  Recommend pulmmonary referral.   #Weight loss/ongoing fatigue-suspect COPD/other causes-defer to PCP for further work-up.  Symptoms not related to [name].  Taper prednisone to 20 mg a day for the next 1 week or so and then stop.  # DISPOSITION: # follow up in  6 months-  MD; labs- cbc/cmp- Dr.B  Cc: Ms.Rogers, NP  All questions were answered. The patient knows to call the clinic with any problems, questions or concerns.   Cammie Sickle, MD 10/22/2020 8:06 AM

## 2020-10-21 NOTE — Patient Instructions (Signed)
#   follow up with Dr.Stoioff's office.

## 2020-11-07 ENCOUNTER — Ambulatory Visit
Admission: RE | Admit: 2020-11-07 | Discharge: 2020-11-07 | Disposition: A | Discharge status: Home / Self Care | Payer: Medicare PPO | Source: Ambulatory Visit | Attending: Physician Assistant | Admitting: Physician Assistant

## 2020-11-07 DIAGNOSIS — N1330 Unspecified hydronephrosis: Secondary | ICD-10-CM | POA: Insufficient documentation

## 2020-11-07 DIAGNOSIS — Z87448 Personal history of other diseases of urinary system: Secondary | ICD-10-CM

## 2020-11-18 ENCOUNTER — Other Ambulatory Visit: Payer: Self-pay

## 2020-11-18 ENCOUNTER — Other Ambulatory Visit: Payer: Medicare PPO

## 2020-11-18 DIAGNOSIS — Z87448 Personal history of other diseases of urinary system: Secondary | ICD-10-CM

## 2020-11-19 LAB — BASIC METABOLIC PANEL
BUN/Creatinine Ratio: 22 (ref 10–24)
BUN: 31 mg/dL — ABNORMAL HIGH (ref 8–27)
CO2: 22 mmol/L (ref 20–29)
Calcium: 9.2 mg/dL (ref 8.6–10.2)
Chloride: 102 mmol/L (ref 96–106)
Creatinine, Ser: 1.38 mg/dL — ABNORMAL HIGH (ref 0.76–1.27)
Glucose: 58 mg/dL — ABNORMAL LOW (ref 65–99)
Potassium: 4.8 mmol/L (ref 3.5–5.2)
Sodium: 139 mmol/L (ref 134–144)
eGFR: 49 mL/min/{1.73_m2} — ABNORMAL LOW (ref >59)

## 2020-11-22 ENCOUNTER — Encounter: Payer: Self-pay | Admitting: Urology

## 2020-11-22 ENCOUNTER — Other Ambulatory Visit: Payer: Self-pay

## 2020-11-22 ENCOUNTER — Ambulatory Visit: Payer: Medicare PPO | Admitting: Urology

## 2020-11-22 VITALS — BP 115/56 | HR 56 | Ht 72.0 in | Wt 150.0 lb

## 2020-11-22 DIAGNOSIS — N390 Urinary tract infection, site not specified: Secondary | ICD-10-CM | POA: Diagnosis not present

## 2020-11-22 DIAGNOSIS — N401 Enlarged prostate with lower urinary tract symptoms: Secondary | ICD-10-CM | POA: Diagnosis not present

## 2020-11-22 DIAGNOSIS — R3989 Other symptoms and signs involving the genitourinary system: Secondary | ICD-10-CM | POA: Diagnosis not present

## 2020-11-22 DIAGNOSIS — N1330 Unspecified hydronephrosis: Secondary | ICD-10-CM | POA: Diagnosis not present

## 2020-11-22 DIAGNOSIS — R338 Other retention of urine: Secondary | ICD-10-CM

## 2020-11-22 LAB — BLADDER SCAN AMB NON-IMAGING: Scan Result: >246

## 2020-11-22 NOTE — Progress Notes (Signed)
11/22/2020 1:05 PM   Spencer Buck 04-20-1931 [id number]  Referring provider: Gae Bon, NP [place],  [place] [address]  Chief Complaint  Patient presents with   Other    HPI: 87 y.o. male presents for follow-up.  Refer to prior office notes 08/19/2020 and 08/21/2020 Renal ultrasound performed 11/07/2020 showed mild left hydronephrosis which was improved from a prior PET CT 10/17/2020 Creatinine/eGFR 11/18/2020 1.38/49 Has varied since 02/2020 from 1.17-1.93 Prior history left hydronephrosis in [place] with renal scan showing 38% function of the left kidney.  He had stent placement briefly He performed intermittent catheterization briefly after his March visit but has not performed recently Remains on tamsulosin and finasteride PET/CT was ordered by Dr. 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 If you have any questions, ask your nurse or doctor.          STOP taking these medications    benzonatate 200 MG capsule Commonly known as:  TESSALON Stopped by: Abbie Sons, MD   hydrochlorothiazide 12.5 MG capsule Commonly known as: MICROZIDE Stopped by: Abbie Sons, MD   predniSONE 20 MG tablet Commonly known as: DELTASONE Stopped by: Abbie Sons, MD   triamcinolone cream 0.5 % Commonly known as: KENALOG Stopped by: Abbie Sons, MD       TAKE these medications    aspirin 81 MG chewable tablet Chew by mouth daily.   Budeson-Glycopyrrol-Formoterol 160-9-4.8 MCG/ACT Aero Inhale 2 puffs into the lungs.   cholecalciferol 25 MCG (1000 UNIT) tablet Commonly known as: VITAMIN D3 Take 1,000 Units by mouth daily.   finasteride 5 MG tablet Commonly known as: PROSCAR Take 5 mg by mouth daily.   MAGNESIUM GLYCINATE PO Take 500 mg by mouth daily.   multivitamin tablet Take 1 tablet by mouth daily.   tamsulosin 0.4 MG Caps capsule Commonly known as: FLOMAX Take 0.8 mg by mouth daily.        Allergies: No Known Allergies  Family History: Family History  Problem Relation Age of Onset   Stroke Paternal Grandmother    Glaucoma Other    Diabetes Other     Social History:  reports that he has been smoking cigarettes. He has a 108.00 pack-year smoking history. He has never used smokeless tobacco. He reports current alcohol use. He reports that he does not use drugs.   Physical Exam: BP (!) 115/56   Pulse (!) 56  Ht 6' (1.829 m)   Wt 150 lb (68 kg)   BMI 20.34 kg/m   Constitutional:  Alert, no acute distress. HEENT: [place] AT, moist mucus membranes.  Trachea midline, no masses. Cardiovascular: No clubbing, cyanosis, or edema. Respiratory: Normal respiratory effort, no increased work of breathing. GI: Abdomen is soft, nontender, nondistended, no abdominal masses GU: Prostate enlarged, 80+ cc and areas of abnormal firmness/induration   Pertinent Imaging: Renal ultrasound and PET/CT were personally reviewed and interpreted   Assessment & Plan:    1.  Left hydronephrosis  Chronic  with undetermined etiology.  We discussed potential causes including obstruction and chronic urinary retention Recommend further evaluation with a diuretic renal scan to assess for any subsequent functional loss of the left kidney and for obstruction  2.  Urinary retention No longer performing intermittent catheterization.  Bladder scan PVR today was a >246  3.  Abnormal DRE Discussed with daughter possibility of prostate cancer that could contribute to weight loss PSA was drawn today After results return will discuss prostate biopsy    Abbie Sons, MD  [facility] [address], [place], [place] [address] [phone]

## 2020-11-23 LAB — URINALYSIS, COMPLETE
Bilirubin, UA: NEGATIVE
Glucose, UA: NEGATIVE
Ketones, UA: NEGATIVE
Nitrite, UA: NEGATIVE
Specific Gravity, UA: 1.01 (ref 1.005–1.030)
Urobilinogen, Ur: 0.2 mg/dL (ref 0.2–1.0)
pH, UA: 7 (ref 5.0–7.5)

## 2020-11-23 LAB — MICROSCOPIC EXAMINATION: WBC, UA: >30 /hpf — AB (ref 0–5)

## 2020-11-23 LAB — PSA: Prostate Specific Ag, Serum: 23.2 ng/mL — ABNORMAL HIGH (ref 0.0–4.0)

## 2020-12-05 ENCOUNTER — Ambulatory Visit
Admission: RE | Admit: 2020-12-05 | Discharge: 2020-12-05 | Disposition: A | Discharge status: Home / Self Care | Payer: Medicare PPO | Source: Ambulatory Visit | Attending: Urology | Admitting: Urology

## 2020-12-05 ENCOUNTER — Other Ambulatory Visit: Payer: Self-pay

## 2020-12-05 DIAGNOSIS — N1330 Unspecified hydronephrosis: Secondary | ICD-10-CM | POA: Diagnosis present

## 2020-12-05 MED ORDER — FUROSEMIDE 10 MG/ML IJ SOLN
34.0000 mg | Freq: Once | INTRAMUSCULAR | Status: AC
  Administered 2020-12-05: 34 mg via INTRAVENOUS
  Filled 2020-12-05: qty 3.4

## 2020-12-05 MED ORDER — TECHNETIUM TC 99M MEBROFENIN IV KIT
4.9600 | PACK | Freq: Once | INTRAVENOUS | Status: AC | PRN
  Administered 2020-12-05: 4.96 via INTRAVENOUS

## 2020-12-08 ENCOUNTER — Telehealth: Payer: Self-pay | Admitting: Urology

## 2020-12-08 NOTE — Telephone Encounter (Signed)
Renal scan showed decreased function of the left kidney at 34% but no evidence of blockage.  His PSA was also elevated at 23 and prostate exam was abnormal.  This could be due to infection but prostate cancer is also a possibility.  To evaluate this further a prostate biopsy would the best next step and could potentially be an explanation for his weight loss.  The biopsy could be performed under sedation in same-day surgery if they desire.  Please let me know if they have any questions and how they would like to proceed.

## 2020-12-09 NOTE — Telephone Encounter (Signed)
Notified patient daughter as instructed. Please call the daughter to schedule, performed under sedation in same-day surgery

## 2020-12-19 ENCOUNTER — Other Ambulatory Visit: Payer: Self-pay | Admitting: Urology

## 2020-12-19 DIAGNOSIS — R972 Elevated prostate specific antigen [PSA]: Secondary | ICD-10-CM

## 2020-12-20 ENCOUNTER — Other Ambulatory Visit: Payer: Self-pay | Admitting: Urology

## 2020-12-20 DIAGNOSIS — R972 Elevated prostate specific antigen [PSA]: Secondary | ICD-10-CM

## 2020-12-27 ENCOUNTER — Other Ambulatory Visit: Payer: Self-pay

## 2020-12-27 DIAGNOSIS — R3989 Other symptoms and signs involving the genitourinary system: Secondary | ICD-10-CM

## 2020-12-27 DIAGNOSIS — R972 Elevated prostate specific antigen [PSA]: Secondary | ICD-10-CM

## 2020-12-27 NOTE — Telephone Encounter (Signed)
Patient's daughter Joycelyn Schmid notified that surgery for Prostate Biopsy under sedation in the OR is scheduled for 01/28/21. Patient is scheduled for pre-op urine culture lab drop off on 01/16/21. Patient is on ASA 81mg  by Dr. Saralyn Pilar, clearance note faxed awaiting response. Patient's daughter verbalized understanding. Mailed surgery reminder to patient.

## 2020-12-30 NOTE — Telephone Encounter (Signed)
Clearance received from Dr. Sharlet Salina office patient is low risk for surgery, ok to stop ASA 7 days prior. Scanned under media. Patient's daughter was notified to stop ASA on 01/21/2021, she verbalized understanding

## 2021-01-09 DIAGNOSIS — J439 Emphysema, unspecified: Secondary | ICD-10-CM | POA: Insufficient documentation

## 2021-01-16 ENCOUNTER — Other Ambulatory Visit: Payer: Self-pay

## 2021-01-16 ENCOUNTER — Other Ambulatory Visit: Payer: Medicare PPO

## 2021-01-16 DIAGNOSIS — R3989 Other symptoms and signs involving the genitourinary system: Secondary | ICD-10-CM

## 2021-01-16 DIAGNOSIS — R972 Elevated prostate specific antigen [PSA]: Secondary | ICD-10-CM

## 2021-01-17 LAB — URINALYSIS, COMPLETE
Bilirubin, UA: NEGATIVE
Glucose, UA: NEGATIVE
Ketones, UA: NEGATIVE
Nitrite, UA: NEGATIVE
Specific Gravity, UA: 1.015 (ref 1.005–1.030)
Urobilinogen, Ur: 0.2 mg/dL (ref 0.2–1.0)
pH, UA: 6 (ref 5.0–7.5)

## 2021-01-17 LAB — MICROSCOPIC EXAMINATION: WBC, UA: >30 /hpf — AB (ref 0–5)

## 2021-01-20 ENCOUNTER — Other Ambulatory Visit
Admission: RE | Admit: 2021-01-20 | Discharge: 2021-01-20 | Disposition: A | Discharge status: Home / Self Care | Payer: Medicare PPO | Source: Ambulatory Visit | Attending: Urology | Admitting: Urology

## 2021-01-20 NOTE — Patient Instructions (Addendum)
Your procedure is scheduled on: Tuesday January 28, 2021. Report to Day Surgery inside [facility] 2nd floor stop by admissions desk first before getting on elevator. To find out your arrival time please call [phone] between 1PM - 3PM on Monday January 27, 2021.  Remember: Instructions that are not followed completely may result in serious medical risk,  up to and including death, or upon the discretion of your surgeon and anesthesiologist your  surgery may need to be rescheduled.     _X__ 1. Do not eat food or drink fluids after midnight the night before your procedure.                 No chewing gum or hard candies.   __X__2.  On the morning of surgery brush your teeth with toothpaste and water, you                may rinse your mouth with mouthwash if you wish.  Do not swallow any toothpaste of mouthwash.     _X__ 3.  No Alcohol for 24 hours before or after surgery.   _X__ 4.  Do Not Smoke or use e-cigarettes For 24 Hours Prior to Your Surgery.                 Do not use any chewable tobacco products for at least 6 hours prior to                 Surgery.  _X__  5.  Do not use any recreational drugs (marijuana, cocaine, heroin, ecstasy, MDMA or other)                For at least one week prior to your surgery.  Combination of these drugs with anesthesia                May have life threatening results.  __X__6.  Notify your doctor if there is any change in your medical condition      (cold, fever, infections).     Do not wear jewelry, make-up, hairpins, clips or nail polish. Do not wear lotions, powders, or perfumes. You may wear deodorant. Do not shave 48 hours prior to surgery. Men may shave face and neck. Do not bring valuables to the hospital.    [facility] is not responsible for any belongings or valuables.  Contacts, dentures or bridgework may not be worn into surgery. Leave your suitcase in the car. After surgery it may be brought to your  room. For patients admitted to the hospital, discharge time is determined by your treatment team.   Patients discharged the day of surgery will not be allowed to drive home.   Make arrangements for someone to be with you for the first 24 hours of your Same Day Discharge.   _X___ Take these medicines the morning of surgery with A SIP OF WATER:    1. finasteride (PROSCAR) 5 MG   2. tamsulosin (FLOMAX) 0.4 MG CAPS   3.   4.  5.  6.  ____ Fleet Enema (as directed)   ____ Use CHG Soap (or wipes) as directed  ____ Use Benzoyl Peroxide Gel as instructed  __X__ Use inhalers on the day of surgery  albuterol (ACCUNEB) 1.25 MG/3ML nebulizer solution  ____ Stop metformin 2 days prior to surgery    ____ Take 1/2 of usual insulin dose the night before surgery. No insulin the morning          of  surgery.   __X__ One week prior to surgery stop aspirin 81 mg as instructed by your doctor.  __X__ One Week prior to surgery- Stop Anti-inflammatories such as Ibuprofen, Aleve, Advil, Motrin, meloxicam (MOBIC), diclofenac, etodolac, ketorolac, Toradol, Daypro, piroxicam, Goody's or BC powders. OK TO USE TYLENOL IF NEEDED   __X__ Stop supplements until after surgery.    ____ Bring C-Pap to the hospital.    If you have any questions regarding your pre-procedure instructions,  Please call Pre-admit Testing at [phone]

## 2021-01-23 ENCOUNTER — Telehealth: Payer: Self-pay | Admitting: Urology

## 2021-01-23 ENCOUNTER — Encounter: Payer: Self-pay | Admitting: Urology

## 2021-01-23 LAB — CULTURE, URINE COMPREHENSIVE

## 2021-01-23 MED ORDER — SULFAMETHOXAZOLE-TRIMETHOPRIM 800-160 MG PO TABS: 1.0000 | ORAL_TABLET | Freq: Two times a day (BID) | ORAL | 0 refills | Status: AC

## 2021-01-23 NOTE — Telephone Encounter (Signed)
Please let patient's daughter know that his preop urine culture is growing bacteria and I sent in Rx Septra DS to [place].  Okay for biopsy on Tuesday if he can start the medication tomorrow

## 2021-01-23 NOTE — Progress Notes (Signed)
Perioperative Services  Pre-Admission/Anesthesia Testing Clinical Review  Date: 01/23/21  Patient Demographics:  Name: Spencer Buck DOB:   11-17-1930 MRN:   [id number]  Planned Surgical Procedure(s):    Case: [id number] Date/Time: 01/28/21 0715   Procedures:      TRANSRECTAL ULTRASOUND - Spoke w/[name] in ultrasound confirmed     PROSTATE BIOPSY   Anesthesia type: Monitor Anesthesia Care   Pre-op diagnosis: elevated PSA, abnormal rectal exam   Location: [facility] OR ROOM 10 / [place] ORS FOR ANESTHESIA GROUP   Surgeons: Spencer Sons, MD     NOTE: Available PAT nursing documentation and vital signs have been reviewed. Clinical nursing staff has updated patient's PMH/PSHx, current medication list, and drug allergies/intolerances to ensure comprehensive history available to assist in medical decision making as it pertains to the aforementioned surgical procedure and anticipated anesthetic course. Extensive review of available clinical information performed. [facility] PM[facility] and PSHx updated with any diagnoses/procedures that  may have been inadvertently omitted during his intake with the pre-admission testing department's nursing staff.  Clinical Discussion:  Spencer Buck is a 87 y.o. male who is submitted for pre-surgical anesthesia review and clearance prior to him undergoing the above procedure. Patient is a Current Smoker (108 pack years). Pertinent PMH includes: CAD, CHF, nonrheumatic aortic valve disorder, cardiomegaly, RBBB, biatrial enlargement, aortic atherosclerosis, PVD, COPD, [name], anemia, splenomegaly.  Patient is followed by cardiology Spencer Pilar, MD). He was last seen in the cardiology clinic on 01/10/2019; notes reviewed.  At the time of his clinic visit, patient doing well overall from a cardiovascular perspective.  He denied any episodes of chest pain, however had chronic exertional dyspnea related to his underlying COPD diagnosis and ongoing smoking.  No PND, orthopnea,  palpitations, vertiginous symptoms, or presyncope/syncope.  Patient with reports of recent peripheral edema, however this has been improved with extremity elevation and compression.  PMH significant for cardiovascular diagnoses, however history is somewhat limited as patient recently moved to [place] from [place] to live with his daughter.  Per notes from his local cardiologist, patient underwent TTE on 06/24/2020 that revealed a normal left ventricular systolic function with an EF of 51%.  There was biatrial enlargement, diastolic dysfunction (unspecified grade), and valvular regurgitation of unknown severity.  Long-term cardiac event monitor study performed on 08/01/2020 revealed a predominantly underlying sinus rhythm with a mean heart rate of 73 bpm.  There was frequent atrial ectopy noted, however no diary entries were submitted.  Blood pressure well controlled at 118/62; takes no interventions.  Patient is not on any type of lipid-lowering therapy for ASCVD prevention.  He is not diabetic.  Patient is under surveillance at the [facility] Rogue Bussing, MD) for his [name] diagnosis.  Patient is active and works outside in his garden.  Functional capacity felt to be at least 4 METS of activity.  No changes were made to his medication regimen.  Patient to follow-up with outpatient cardiology in 6 months or sooner if needed.    Patient is scheduled for an transrectal ultrasound and prostate biopsy on 01/28/2021 with Dr. John Giovanni, MD.  Given patient's past medical history significant for cardiovascular diagnoses, presurgical cardiac clearance was sought by the performing surgeon's office and PAT team. Per cardiology, "this patient is optimized for surgery and may proceed with the planned procedural course with a LOW risk of significant perioperative cardiovascular complications". This patient is on daily antiplatelet therapy. He has been instructed on recommendations for holding his  daily low-dose ASA  for 7 days prior to his procedure with plans to restart as soon as postoperative bleeding risk felt to be minimized by his attending surgeon. The patient has been instructed that his last dose of his anticoagulant will be on 01/20/2021.  Patient denies previous perioperative complications with anesthesia in the past.  In review of patient's EMR, there are no records available for review regarding past procedural/anesthetic courses within the [facility] system.    Vitals with BMI 01/20/2021 11/22/2020 10/21/2020  Height '6\' 0"'$  '6\' 0"'$  '6\' 0"'$   Weight 155 lbs 150 lbs 151 lbs  BMI 21.02 [id number] 0000000  Systolic - [id number]  Diastolic - 56 64  Pulse - 56 66    Providers/Specialists:   NOTE: Primary physician provider listed below. Patient may have been seen by APP or partner within same practice.   PROVIDER ROLE / SPECIALTY LAST Spencer Kelp, MD Urology (Surgeon) 11/22/2020  Spencer Bon, NP Primary Care Provider ???  Spencer Cowman, MD Cardiology 01/10/2019  Spencer Dalton, MD Oncology 10/21/2020   Allergies:  Patient has no known allergies.  Current Home Medications:   No current facility-administered medications for this encounter.    aspirin 81 MG EC tablet   Cholecalciferol (VITAMIN D) 125 MCG (5000 UT) CAPS   finasteride (PROSCAR) 5 MG tablet   MAGNESIUM GLYCINATE PO   Phenazopyridine HCl (AZO TABS PO)   tamsulosin (FLOMAX) 0.4 MG CAPS capsule   albuterol (ACCUNEB) 1.25 MG/3ML nebulizer solution   Budeson-Glycopyrrol-Formoterol (BREZTRI AEROSPHERE) 160-9-4.8 MCG/ACT AERO   [employer] CRANBERRY PO   Multiple Vitamin (MULTIVITAMIN) tablet   History:   Past Medical History:  Diagnosis Date   Anemia    Aortic atherosclerosis (HCC)    Biatrial enlargement    BPH (benign prostatic hyperplasia)    CAD (coronary artery disease)    Cardiomegaly    [name] (chronic lymphocytic leukemia) (HCC)    COPD (chronic obstructive pulmonary disease) (HCC)     Diastolic heart failure (HCC)    Nicotine dependence, cigarettes, uncomplicated    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)    Nonrheumatic aortic valve disorder    PVD (peripheral vascular disease) (HCC)    Right bundle branch block    Splenomegaly    Urinary retention    Past Surgical History:  Procedure Laterality Date   HERNIA REPAIR Left    KNEE SURGERY Left    torn ligament   Family History  Problem Relation Age of Onset   Stroke Paternal Grandmother    Glaucoma Other    Diabetes Other    Social History   Tobacco Use   Smoking status: Every Day    Packs/day: 1.50    Years: 72.00    Pack years: 108.00    Types: Cigarettes   Smokeless tobacco: Never  Substance Use Topics   Alcohol use: Yes    Comment: rarely   Drug use: Never    Pertinent Clinical Results:  LABS: Labs reviewed: Acceptable for surgery.  Lab Results  Component Value Date   WBC 19.0 (H) 10/21/2020   HGB 12.0 (L) 10/21/2020   HCT 37.6 (L) 10/21/2020   MCV 90.4 10/21/2020   PLT 156 10/21/2020   Lab Results  Component Value Date   NA 139 11/18/2020   K 4.8 11/18/2020   CO2 22 11/18/2020   GLUCOSE 58 (L) 11/18/2020   BUN 31 (H) 11/18/2020   CREATININE 1.38 (H) 11/18/2020   CALCIUM 9.2 11/18/2020   GFRNONAA >60  10/21/2020   GFRAA 35 (L) 03/09/2020    ECG: Date: 06/12/2020 Rate: 67 bpm Rhythm: Sinus rhythm with frequent PVCs; RBBB Intervals: PR 140 ms. QRS 134 ms. QTc 457 ms. ST segment and T wave changes: No acute ST or T wave abnormalities Comparison: Similar to tracing obtained on 03/09/2020   IMAGING / PROCEDURES: LONG TERM CARDIAC EVENT MONITOR STUDY performed on 08/01/2020 Predominantly underlying sinus rhythm with a mean heart rate of 73 bpm.   There was frequent atrial ectopy noted, however no diary entries were submitted  NUCLEAR MEDICINE RENAL SCAN WITH DIURETIC ADMINISTRATION performed on 12/05/2020 Normal right renogram Dilated left renal collecting system,  though there is not adequate washout of tracer from the left kidney following Lasix administration, including an absence of significant urinary outflow obstruction Asymmetric renal function as above  PET SCANNING INITIAL SKULL BASE TO THIGH performed on 10/17/2020 No clear evidence of [name] No evidence of active lymphoma on FDG PET scan No enlarged lymph nodes Borderline enlarged spleen with normal metabolic activity Suggestion of UPJ obstruction on the left.  Consider CT urogram for further evaluation Coronary artery calcification Aortic sclerosis  Impression and Plan:  Spencer Buck has been referred for pre-anesthesia review and clearance prior to him undergoing the planned anesthetic and procedural courses. Available labs, pertinent testing, and imaging results were personally reviewed by me. This patient has been appropriately cleared by cardiology with an overall LOW risk of significant perioperative cardiovascular complications.  Patient will need pre-operative EKG as it was not performed with routine PAT testing.  His last ECG was not able to be obtained from [facility] dated 06/12/2020.  Tracing will need to be reviewed by surgical/anesthetic team prior to proceeding.   Based on clinical review performed today (01/23/21), barring any significant acute changes in the patient's overall condition, it is anticipated that he will be able to proceed with the planned surgical intervention. Any acute changes in clinical condition may necessitate his procedure being postponed and/or cancelled. Patient will meet with anesthesia team (MD and/or CRNA) on the day of his procedure for preoperative evaluation/assessment. Questions regarding anesthetic course will be fielded at that time.   Pre-surgical instructions were reviewed with the patient during his PAT appointment and questions were fielded by PAT clinical staff. Patient was advised that if any questions or concerns arise prior to his  procedure then he should return a call to PAT and/or his surgeon's office to discuss.  Honor Loh, MSN, APRN, FNP-C, CEN [facility]  Peri-operative Services Nurse Practitioner Phone: [phone] Fax: [phone] 01/23/21 10:26 AM  NOTE: This note has been prepared using Dragon dictation software. Despite my best ability to proofread, there is always the potential that unintentional transcriptional errors may still occur from this process.

## 2021-01-24 NOTE — Telephone Encounter (Signed)
Patient's daughter notified.

## 2021-01-27 MED ORDER — SODIUM CHLORIDE 0.9 % IV SOLN
1.0000 g | INTRAVENOUS | Status: AC
  Administered 2021-01-28: 1 g via INTRAVENOUS
  Filled 2021-01-27: qty 1

## 2021-01-28 ENCOUNTER — Encounter
Admission: RE | Disposition: A | Discharge status: Home / Self Care | Payer: Self-pay | Source: Home / Self Care | Attending: Urology

## 2021-01-28 ENCOUNTER — Encounter: Payer: Self-pay | Admitting: Urology

## 2021-01-28 ENCOUNTER — Ambulatory Visit
Admission: RE | Admit: 2021-01-28 | Discharge: 2021-01-28 | Disposition: A | Discharge status: Home / Self Care | Payer: Medicare PPO | Attending: Urology | Admitting: Urology

## 2021-01-28 ENCOUNTER — Ambulatory Visit: Payer: Medicare PPO | Admitting: Urgent Care

## 2021-01-28 ENCOUNTER — Ambulatory Visit
Admission: RE | Admit: 2021-01-28 | Discharge: 2021-01-28 | Disposition: A | Discharge status: Home / Self Care | Payer: Medicare PPO | Source: Ambulatory Visit | Attending: Urology | Admitting: Urology

## 2021-01-28 DIAGNOSIS — Z856 Personal history of leukemia: Secondary | ICD-10-CM | POA: Diagnosis not present

## 2021-01-28 DIAGNOSIS — I2510 Atherosclerotic heart disease of native coronary artery without angina pectoris: Secondary | ICD-10-CM | POA: Insufficient documentation

## 2021-01-28 DIAGNOSIS — F17200 Nicotine dependence, unspecified, uncomplicated: Secondary | ICD-10-CM | POA: Diagnosis not present

## 2021-01-28 DIAGNOSIS — C61 Malignant neoplasm of prostate: Secondary | ICD-10-CM | POA: Insufficient documentation

## 2021-01-28 DIAGNOSIS — Z79899 Other long term (current) drug therapy: Secondary | ICD-10-CM | POA: Diagnosis not present

## 2021-01-28 DIAGNOSIS — J449 Chronic obstructive pulmonary disease, unspecified: Secondary | ICD-10-CM | POA: Diagnosis not present

## 2021-01-28 DIAGNOSIS — N400 Enlarged prostate without lower urinary tract symptoms: Secondary | ICD-10-CM | POA: Diagnosis not present

## 2021-01-28 DIAGNOSIS — I739 Peripheral vascular disease, unspecified: Secondary | ICD-10-CM | POA: Insufficient documentation

## 2021-01-28 DIAGNOSIS — R3989 Other symptoms and signs involving the genitourinary system: Secondary | ICD-10-CM

## 2021-01-28 DIAGNOSIS — Z7982 Long term (current) use of aspirin: Secondary | ICD-10-CM | POA: Diagnosis not present

## 2021-01-28 DIAGNOSIS — I5032 Chronic diastolic (congestive) heart failure: Secondary | ICD-10-CM | POA: Insufficient documentation

## 2021-01-28 DIAGNOSIS — R972 Elevated prostate specific antigen [PSA]: Secondary | ICD-10-CM

## 2021-01-28 DIAGNOSIS — I4510 Unspecified right bundle-branch block: Secondary | ICD-10-CM | POA: Insufficient documentation

## 2021-01-28 HISTORY — DX: Atherosclerotic heart disease of native coronary artery without angina pectoris: I25.10

## 2021-01-28 HISTORY — DX: Chronic lymphocytic leukemia of B-cell type not having achieved remission: C91.10

## 2021-01-28 HISTORY — DX: Splenomegaly, not elsewhere classified: R16.1

## 2021-01-28 HISTORY — DX: Atherosclerosis of aorta: I70.0

## 2021-01-28 HISTORY — PX: PROSTATE BIOPSY: SHX241

## 2021-01-28 HISTORY — PX: TRANSRECTAL ULTRASOUND: SHX5146

## 2021-01-28 HISTORY — DX: Cardiomegaly: I51.7

## 2021-01-28 SURGERY — ULTRASOUND, RECTAL APPROACH
Anesthesia: General

## 2021-01-28 MED ORDER — PROPOFOL 500 MG/50ML IV EMUL
INTRAVENOUS | Status: AC
  Filled 2021-01-28: qty 50

## 2021-01-28 MED ORDER — PHENYLEPHRINE HCL (PRESSORS) 10 MG/ML IV SOLN
INTRAVENOUS | Status: DC | PRN
  Administered 2021-01-28: 150 ug via INTRAVENOUS

## 2021-01-28 MED ORDER — MEPERIDINE HCL 25 MG/ML IJ SOLN: 6.2500 mg | INTRAMUSCULAR | Status: DC | PRN

## 2021-01-28 MED ORDER — ONDANSETRON HCL 4 MG/2ML IJ SOLN: 4.0000 mg | Freq: Once | INTRAMUSCULAR | Status: DC | PRN

## 2021-01-28 MED ORDER — FAMOTIDINE 20 MG PO TABS
ORAL_TABLET | ORAL | Status: AC
  Administered 2021-01-28: 20 mg via ORAL
  Filled 2021-01-28: qty 1

## 2021-01-28 MED ORDER — LACTATED RINGERS IV SOLN: INTRAVENOUS | Status: DC

## 2021-01-28 MED ORDER — PROPOFOL 10 MG/ML IV BOLUS
INTRAVENOUS | Status: DC | PRN
  Administered 2021-01-28: 40 mg via INTRAVENOUS
  Administered 2021-01-28 (×2): 20 mg via INTRAVENOUS

## 2021-01-28 MED ORDER — PROPOFOL 10 MG/ML IV BOLUS
INTRAVENOUS | Status: AC
  Filled 2021-01-28: qty 60

## 2021-01-28 MED ORDER — ORAL CARE MOUTH RINSE: 15.0000 mL | Freq: Once | OROMUCOSAL | Status: AC

## 2021-01-28 MED ORDER — PHENYLEPHRINE HCL (PRESSORS) 10 MG/ML IV SOLN
INTRAVENOUS | Status: AC
  Filled 2021-01-28: qty 1

## 2021-01-28 MED ORDER — CHLORHEXIDINE GLUCONATE 0.12 % MT SOLN
OROMUCOSAL | Status: AC
  Administered 2021-01-28: 15 mL via OROMUCOSAL
  Filled 2021-01-28: qty 15

## 2021-01-28 MED ORDER — FENTANYL CITRATE (PF) 100 MCG/2ML IJ SOLN: 25.0000 ug | INTRAMUSCULAR | Status: DC | PRN

## 2021-01-28 MED ORDER — FAMOTIDINE 20 MG PO TABS: 20.0000 mg | ORAL_TABLET | Freq: Once | ORAL | Status: AC

## 2021-01-28 MED ORDER — CHLORHEXIDINE GLUCONATE 0.12 % MT SOLN: 15.0000 mL | Freq: Once | OROMUCOSAL | Status: AC

## 2021-01-28 MED ORDER — FENTANYL CITRATE (PF) 100 MCG/2ML IJ SOLN
INTRAMUSCULAR | Status: AC
  Filled 2021-01-28: qty 2

## 2021-01-28 SURGICAL SUPPLY — 9 items
COVER MAYO STAND REUSABLE (DRAPES) ×2 IMPLANT
GAUZE SPONGE 4X4 12PLY STRL (GAUZE/BANDAGES/DRESSINGS) ×2 IMPLANT
GLOVE SURG UNDER POLY LF SZ7.5 (GLOVE) ×2 IMPLANT
GUIDE NEEDLE ENDOCAV 16-18 CVR (NEEDLE) IMPLANT
INST BIOPSY MAXCORE 18GX25 (NEEDLE) ×2 IMPLANT
KIT TURNOVER CYSTO (KITS) ×2 IMPLANT
MANIFOLD NEPTUNE II (INSTRUMENTS) ×2 IMPLANT
NEEDLE GUIDE BIOPSY 644068 (NEEDLE) ×2 IMPLANT
SURGILUBE 2OZ TUBE FLIPTOP (MISCELLANEOUS) ×2 IMPLANT

## 2021-01-28 NOTE — Anesthesia Postprocedure Evaluation (Signed)
Anesthesia Post Note  Patient: Spencer Buck  Procedure(s) Performed: TRANSRECTAL ULTRASOUND PROSTATE BIOPSY  Patient location during evaluation: PACU Anesthesia Type: General Level of consciousness: awake and alert, awake and oriented Pain management: pain level controlled Vital Signs Assessment: post-procedure vital signs reviewed and stable Respiratory status: spontaneous breathing, nonlabored ventilation and respiratory function stable Cardiovascular status: blood pressure returned to baseline and stable Postop Assessment: no apparent nausea or vomiting Anesthetic complications: no   No notable events documented.   Last Vitals:  Vitals:   01/28/21 0836 01/28/21 0847  BP: (!) 97/55 (!) 112/54  Pulse: 69 88  Resp: (!) 21 16  Temp: 36.9 C (!) 36.1 C  SpO2: 96% 99%    Last Pain:  Vitals:   01/28/21 0847  TempSrc: Temporal  PainSc: 0-No pain                 Phill Mutter

## 2021-01-28 NOTE — Anesthesia Preprocedure Evaluation (Addendum)
Anesthesia Evaluation  Patient identified by MRN, date of birth, ID band Patient awake    Reviewed: Allergy & Precautions, NPO status , Patient's Chart, lab work & pertinent test results  Airway Mallampati: II  TM Distance: >3 FB Neck ROM: Full    Dental  (+) Edentulous Upper, Edentulous Lower, Lower Dentures, Partial Upper   Pulmonary neg pulmonary ROS, Current Smoker,    Pulmonary exam normal        Cardiovascular + CAD and + Peripheral Vascular Disease  Normal cardiovascular exam+ dysrhythmias      Neuro/Psych negative neurological ROS  negative psych ROS   GI/Hepatic negative GI ROS, Neg liver ROS,   Endo/Other  negative endocrine ROS  Renal/GU negative Renal ROS  negative genitourinary   Musculoskeletal negative musculoskeletal ROS (+)   Abdominal   Peds negative pediatric ROS (+)  Hematology negative hematology ROS (+) anemia ,   Anesthesia Other Findings Anemia    BPH (benign prostatic hyperplasia)  Cardiomegaly  2D echocardiogram on 06/24/2020 at his primary care provider's office showed normal left ventricular function with LVEF 51% with biatrial enlargement, diastolic dysfunction, unspecified grade, and mitral, aortic, and tricuspid regurgitation, also unspecified degree of severity   COPD (chronic obstructive pulmonary disease) (HCC)    Diastolic heart failure (HCC)  Nicotine dependence, cigarettes, uncomplicated    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)    Nonrheumatic aortic valve disorder  PVD (peripheral vascular disease) (HCC) Right bundle branch block  Urinary retention       Reproductive/Obstetrics negative OB ROS                            Anesthesia Physical Anesthesia Plan  ASA: 3  Anesthesia Plan: General   Post-op Pain Management:    Induction: Intravenous  PONV Risk Score and Plan: 2 and Ondansetron  Airway Management Planned: Natural  Airway and Nasal Cannula  Additional Equipment:   Intra-op Plan:   Post-operative Plan: Extubation in OR  Informed Consent:   Plan Discussed with: CRNA, Anesthesiologist and Surgeon  Anesthesia Plan Comments:        Anesthesia Quick Evaluation

## 2021-01-28 NOTE — Progress Notes (Signed)
01/28/21 0733  Clinical Encounter Type  Visited With Patient  Visit Type Initial;Spiritual support;Social support  Referral From Chaplain  Consult/Referral To Spencer Buck visited Spencer Buck while doing rounds, with the nurse at bedside. No family was present in the room at that time. Pt was in good spirits and watching tv before his same day surgery. PT and Chaplain met about a week ago on the elevator. The PT's daughter was also on the elevator during this interaction.  He stated to the nurse, "this man makes everyone feels so much better. He brings joy to people's lives." Chaplain thanked him and gave room for him to express his emotions. The Pt was warm and engaging. The PT spoke of deep faith in God and being at peace. Chaplain ministered with prayer, emotional support, and compassionate presence.

## 2021-01-28 NOTE — Op Note (Signed)
Preoperative diagnosis:  Elevated PSA Abnormal prostate exam  Postoperative diagnosis:  Same  Procedure: Transrectal sound prostate Transrectal prostate biopsy  Surgeon: Abbie Sons, MD  Anesthesia: MAC  Complications: None  Intraoperative findings:  Prostate volume 60 cc.  Hypoechoic left peripheral zone apex to base  EBL: Minimal  Specimens: 12 core prostate biopsies  Indication: Spencer Buck is a 87 y.o. patient with a firm, irregular left prostate on DRE and a PSA 23.2.  He has had increased weight loss and patient and family have elected to proceed with prostate biopsy.  After reviewing the management options for treatment, he elected to proceed with the above surgical procedure(s). We have discussed the potential benefits and risks of the procedure, side effects of the proposed treatment, the likelihood of the patient achieving the goals of the procedure, and any potential problems that might occur during the procedure or recuperation. Informed consent has been obtained.  Description of procedure:  The patient was taken to the operating room and was placed in the left lateral decubitus position on the OR stretcher.  He was transferred to the OR table.  Sedation was obtained by anesthesia. Preoperative antibiotics were administered. A preoperative time-out was performed.   Digital rectal exam was performed with findings as described above.  A transrectal ultrasound probe of the biopsy guide was lubricated and passed per rectum.  Transrectal ultrasound was performed and transverse and sagittal views with findings as described above.  Standard 12 core biopsies were then performed in the usual fashion.  The ultrasound probe was removed and no significant bleeding was noted.  He was transferred to the PACU in stable condition.  Plan: Postop follow-up scheduled 02/12/2021   Abbie Sons, M.D.

## 2021-01-28 NOTE — Interval H&P Note (Signed)
History and Physical Interval Note:  01/28/2021 7:20 AM  Spencer Buck  has presented today for surgery, with the diagnosis of elevated PSA, abnormal rectal exam.  The various methods of treatment have been discussed with the patient and family. After consideration of risks, benefits and other options for treatment, the patient has consented to  Procedure(s) with comments: TRANSRECTAL ULTRASOUND (N/A) - Spoke w/[name] in ultrasound confirmed PROSTATE BIOPSY (N/A) as a surgical intervention.  The patient's history has been reviewed, patient examined, no change in status, stable for surgery.  I have reviewed the patient's chart and labs.  Questions were answered to the patient's satisfaction.     [place]

## 2021-01-28 NOTE — Transfer of Care (Signed)
Immediate Anesthesia Transfer of Care Note  Patient: Spencer Buck  Procedure(s) Performed: TRANSRECTAL ULTRASOUND PROSTATE BIOPSY  Patient Location: PACU  Anesthesia Type:General  Level of Consciousness: awake, drowsy and patient cooperative  Airway & Oxygen Therapy: Patient Spontanous Breathing  Post-op Assessment: Report given to RN, Post -op Vital signs reviewed and stable and Patient moving all extremities  Post vital signs: Reviewed and stable  Last Vitals:  Vitals Value Taken Time  BP 95/51 01/28/21 0817  Temp 36.9 C 01/28/21 0817  Pulse 34 01/28/21 0818  Resp 30 01/28/21 0818  SpO2 100 % 01/28/21 0818  Vitals shown include unvalidated device data.  Last Pain:  Vitals:   01/28/21 0628  TempSrc: Oral  PainSc: 0-No pain         Complications: No notable events documented.

## 2021-01-28 NOTE — Discharge Instructions (Addendum)
AMBULATORY SURGERY  DISCHARGE INSTRUCTIONS   The drugs that you were given will stay in your system until tomorrow so for the next 24 hours you should not:  Drive an automobile Make any legal decisions Drink any alcoholic beverage   You may resume regular meals tomorrow.  Today it is better to start with liquids and gradually work up to solid foods.  You may eat anything you prefer, but it is better to start with liquids, then soup and crackers, and gradually work up to solid foods.   Please notify your doctor immediately if you have any unusual bleeding, trouble breathing, redness and pain at the surgery site, drainage, fever, or pain not relieved by medication.                                       Additional Instructions:  Blood per rectum and blood in the urine is normal post procedure Call office for excessive bleeding from the rectum or urinary tract (tomato juice in appearance) Call or proceed to ED for fever greater than 101 degrees You will be contacted with your pathology results

## 2021-01-28 NOTE — H&P (Signed)
Urology H&P  History of Present Illness: Spencer Buck is a 87 y.o. male with an abnormal DRE and PSA 23.2.  He has had increased weight loss.  They have elected proceed with prostate biopsy.  Preop urine culture with bacteria which is asymptomatic however patient was started on Septra DS 5 days prior to procedure and will receive IV antibiotic preoperatively.  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   aspirin 81 MG EC tablet Chew 81 mg by mouth at bedtime.   Budeson-Glycopyrrol-Formoterol (BREZTRI AEROSPHERE) 160-9-4.8 MCG/ACT AERO Inhale into the lungs.   Cholecalciferol (VITAMIN D) 125 MCG (5000 UT) CAPS Take 5,000 Units by mouth daily.   [employer] CRANBERRY PO Take by mouth.   finasteride (PROSCAR) 5 MG tablet Take 5 mg by mouth daily.   MAGNESIUM GLYCINATE PO Take 500 mg by mouth daily.   Multiple Vitamin (MULTIVITAMIN) tablet Take 1 tablet by mouth daily.   Phenazopyridine HCl (AZO TABS PO) Take 1 tablet by mouth daily.   sulfamethoxazole-trimethoprim (BACTRIM DS) 800-160 MG tablet Take 1 tablet by mouth 2 (two) times daily for 7 days.   tamsulosin (FLOMAX)  0.4 MG CAPS capsule Take 0.4 mg by mouth in the morning and at bedtime.    Allergies: No Known Allergies  Family History  Problem Relation Age of Onset   Stroke Paternal Grandmother    Glaucoma Other    Diabetes Other     Social History:  reports that he has been smoking cigarettes. He has a 108.00 pack-year smoking history. He has never used smokeless tobacco. He reports current alcohol use. He reports that he does not use drugs.  ROS: A complete review of systems was performed.  All systems are negative except for pertinent findings as noted.  Physical Exam:  Vital signs in last 24 hours: Temp:  [97.7 F (36.5 C)] 97.7 F (36.5 C) (08/16 0628) Pulse Rate:  [83] 83 (08/16 0628) Resp:  [16] 16 (08/16 0628) BP: (116)/(54) 116/54 (08/16 0628) SpO2:  [99 %] 99 % (08/16 0628) Weight:  [70.3 kg] 70.3 kg (08/16 0628) Constitutional:  Alert and oriented, No acute distress HEENT: [place] AT, moist mucus membranes.  Trachea midline, no masses Cardiovascular: Regular rate and rhythm, no clubbing, cyanosis, or edema. Respiratory: Normal respiratory effort, lungs clear bilaterally    Laboratory Data:  No results for input(s): WBC, HGB, HCT in the last 72 hours. No results for input(s): NA, K, CL, CO2, GLUCOSE, BUN, CREATININE, CALCIUM in the last 72 hours. No results for input(s): LABPT, INR in the  last 72 hours. No results for input(s): LABURIN in the last 72 hours. Results for orders placed or performed in visit on 01/16/21  CULTURE, URINE COMPREHENSIVE     Status: Abnormal   Collection Time: 01/16/21  2:44 PM   Specimen: Urine   UR  Result Value Ref Range Status   Urine Culture, Comprehensive Final report (A)  Final   Organism ID, Bacteria Klebsiella aerogenes (A)  Final    Comment: formerly Enterobacter aerogenes Greater than 100,000 colony forming units per mL    ANTIMICROBIAL SUSCEPTIBILITY Comment  Final    Comment:       ** S = Susceptible; I = Intermediate; R = Resistant  **                    P = Positive; N = Negative             MICS are expressed in micrograms per mL    Antibiotic                 RSLT#1    RSLT#2    RSLT#3    RSLT#4 Amoxicillin/Clavulanic Acid    R Cefazolin                      R Cefepime                       S Ceftriaxone                    S Cefuroxime                     R Ciprofloxacin                  S Ertapenem                      S Gentamicin                     S Imipenem                       R Levofloxacin                   S Meropenem                      S Nitrofurantoin                 I Tetracycline                   S Tobramycin                     S Trimethoprim/Sulfa             S   Microscopic Examination     Status: Abnormal   Collection Time: 01/16/21  2:44 PM   Urine  Result Value Ref Range Status   WBC, UA >30 (A) 0 - 5 /hpf Final   RBC 3-10 (A) 0 - 2 /hpf Final   Epithelial Cells (non renal) 0-10 0 - 10 /hpf Final   Bacteria, UA Moderate (A) None seen/Few Final    Impression/Assessment:  Elevated PSA with abnormal DRE  Plan:  TRUS/biopsy prostate under sedation The procedure has been discussed in detail including potential risks of bleeding and infection/sepsis.  All questions  were answered and he desires to proceed   01/28/2021, 7:07 AM  John Giovanni,  MD

## 2021-01-29 LAB — SURGICAL PATHOLOGY

## 2021-01-30 ENCOUNTER — Telehealth: Payer: Self-pay | Admitting: Urology

## 2021-01-30 NOTE — Telephone Encounter (Signed)
I spoke with Spencer Buck daughters [name] and [name] regarding his prostate pathology results.  He had no post biopsy problems.  12/12 cores were positive for Gleason 9 adenocarcinoma.  He has [name] and a recent PET/CT showed no skeletal involvement.  Based on his high-grade cancer I did recommend hormonal therapy.  We discussed side effects including hot flashes, fatigue.  He follows Dr. Rogue Bussing in the [place] for [name] and wanted to know if he would have his injections there.  I informed her we could do it either here or there if Dr. Rogue Bussing desired.  I will message Dr. Rogue Bussing.  All of their questions were answered.

## 2021-02-03 ENCOUNTER — Other Ambulatory Visit: Payer: Self-pay | Admitting: Internal Medicine

## 2021-02-03 DIAGNOSIS — C61 Malignant neoplasm of prostate: Secondary | ICD-10-CM | POA: Insufficient documentation

## 2021-02-05 ENCOUNTER — Other Ambulatory Visit: Payer: Self-pay

## 2021-02-05 ENCOUNTER — Telehealth: Payer: Self-pay | Admitting: Urology

## 2021-02-05 ENCOUNTER — Other Ambulatory Visit: Payer: Medicare PPO

## 2021-02-05 DIAGNOSIS — N390 Urinary tract infection, site not specified: Secondary | ICD-10-CM

## 2021-02-05 NOTE — Telephone Encounter (Signed)
Advised patient daughter to not let him re sure cathter. He will be schedule with pa.

## 2021-02-05 NOTE — Telephone Encounter (Signed)
Pt called to let us know pt has been reusing catheters.  He just finished meds for a UTI.  Daughter thinks he either re-infected himself or never got rid of it.

## 2021-02-06 LAB — MICROSCOPIC EXAMINATION

## 2021-02-06 LAB — URINALYSIS, COMPLETE
Bilirubin, UA: NEGATIVE
Glucose, UA: NEGATIVE
Ketones, UA: NEGATIVE
Nitrite, UA: NEGATIVE
Specific Gravity, UA: 1.01 (ref 1.005–1.030)
Urobilinogen, Ur: 0.2 mg/dL (ref 0.2–1.0)
pH, UA: 6 (ref 5.0–7.5)

## 2021-02-12 ENCOUNTER — Other Ambulatory Visit: Payer: Self-pay | Admitting: *Deleted

## 2021-02-12 ENCOUNTER — Other Ambulatory Visit: Payer: Self-pay

## 2021-02-12 ENCOUNTER — Encounter: Payer: Self-pay | Admitting: Urology

## 2021-02-12 ENCOUNTER — Ambulatory Visit (INDEPENDENT_AMBULATORY_CARE_PROVIDER_SITE_OTHER): Payer: Medicare PPO | Admitting: Urology

## 2021-02-12 VITALS — BP 109/59 | HR 85 | Ht 72.0 in | Wt 151.9 lb

## 2021-02-12 DIAGNOSIS — C61 Malignant neoplasm of prostate: Secondary | ICD-10-CM

## 2021-02-12 DIAGNOSIS — R351 Nocturia: Secondary | ICD-10-CM | POA: Diagnosis not present

## 2021-02-12 LAB — CULTURE, URINE COMPREHENSIVE

## 2021-02-12 MED ORDER — NITROFURANTOIN MONOHYD MACRO 100 MG PO CAPS: 100.0000 mg | ORAL_CAPSULE | Freq: Two times a day (BID) | ORAL | 0 refills | Status: AC

## 2021-02-13 NOTE — Progress Notes (Signed)
02/12/2021 9:37 PM   Spencer Buck 05/11/1931 [id number]  Referring provider: Gae Bon, NP [place],  [place] [address]  Chief Complaint  Patient presents with   Routine Post Op    HPI: 87 y.o. male who underwent biopsy under sedation 01/28/2021 for an abnormal DRE and PSA of 23.2.  Prostate volume was calculated at 60 cc.  12/12 cores were positive for Gleason 4+5/5+4 adenocarcinoma Recent PET/CT done for [name] showed no skeletal activity I spoke with his daughters on 8/18 regarding the pathology report and they have discussed this with their father  I had recommended starting ADT and also messaged Dr. Rogue Bussing who stated they could administer leuprolide at the time of his [name] visits.  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)    Nonrheumatic aortic valve disorder    PVD (peripheral vascular disease) (HCC)    Right bundle branch block    Splenomegaly    Urinary retention     Surgical History: Past Surgical History:  Procedure Laterality Date   HERNIA REPAIR Left    KNEE SURGERY Left    torn ligament   PROSTATE BIOPSY N/A 01/28/2021   Procedure: PROSTATE BIOPSY;  Surgeon: Abbie Sons, MD;  Location: [facility] ORS;  Service: Urology;  Laterality: N/A;   TRANSRECTAL ULTRASOUND N/A 01/28/2021   Procedure: TRANSRECTAL ULTRASOUND;  Surgeon: Abbie Sons, MD;   Location: [facility] ORS;  Service: Urology;  Laterality: N/A;  Spoke w/[name] in ultrasound confirmed    Home Medications:  Allergies as of 02/12/2021   No Known Allergies      Medication List        Accurate as of February 12, 2021 11:59 PM. If you have any questions, ask your nurse or doctor.          STOP taking these medications    AZO TABS PO Stopped by: Abbie Sons, MD       TAKE these medications    albuterol 1.25 MG/3ML nebulizer solution Commonly known as: ACCUNEB Take 1 ampule by nebulization every 6 (six) hours as needed for wheezing.   aspirin 81 MG EC tablet Chew 81 mg by mouth at bedtime.   Breztri Aerosphere 160-9-4.8 MCG/ACT Aero Generic drug: Budeson-Glycopyrrol-Formoterol Inhale into the lungs.   [employer]S CRANBERRY PO Take by mouth.   finasteride 5 MG tablet Commonly known as: PROSCAR Take 5 mg by mouth daily.   MAGNESIUM GLYCINATE PO Take 500 mg by mouth daily.   multivitamin tablet Take 1 tablet by mouth daily.   nitrofurantoin (macrocrystal-monohydrate) 100 MG capsule Commonly known as: Macrobid Take 1 capsule (100 mg total) by mouth 2 (two) times daily for 7 days. Started by: Despina Hidden, CMA   tamsulosin 0.4 MG Caps capsule Commonly known as: FLOMAX Take 0.4 mg by mouth in the morning and at bedtime.   Vitamin D 125  MCG (5000 UT) Caps Take 5,000 Units by mouth daily.        Allergies: No Known Allergies  Family History: Family History  Problem Relation Age of Onset   Stroke Paternal Grandmother    Glaucoma Other    Diabetes Other     Social History:  reports that he has been smoking cigarettes. He has a 108.00 pack-year smoking history. He has never used smokeless tobacco. He reports current alcohol use. He reports that he does not use drugs.   Physical Exam: BP (!) 109/59   Pulse 85   Ht 6' (1.829 m)   Wt 151 lb 14.4 oz (68.9 kg)   BMI 20.60 kg/m   Constitutional:  Alert and oriented, No acute  distress. HEENT: [place] AT, moist mucus membranes.  Trachea midline, no masses. Cardiovascular: No clubbing, cyanosis, or edema. Respiratory: Normal respiratory effort, no increased work of breathing.   Assessment & Plan:    1.  High risk prostate cancer 12/12 cores positive for Gleason 4+5/5+4 adenocarcinoma He is agreeable to ADT and has an appoint with Dr. Rogue Bussing 9/12 to discuss further We discussed the most common side effects of tiredness, fatigue and hot flashes I also discussed that ADT will reduce his prostate size which may improve his voiding pattern Continue CIC with new catheters each time Follow-up with me 4 months for reassessment of his voiding pattern  2. Nocturia May improve with ADT   Abbie Sons, MD  [facility] [address] [place], [place] [address] [phone]

## 2021-02-16 ENCOUNTER — Encounter: Payer: Self-pay | Admitting: Urology

## 2021-02-21 ENCOUNTER — Other Ambulatory Visit: Payer: Medicare PPO

## 2021-02-21 ENCOUNTER — Ambulatory Visit: Payer: Medicare PPO | Admitting: Internal Medicine

## 2021-02-21 ENCOUNTER — Ambulatory Visit: Payer: Medicare PPO

## 2021-02-24 ENCOUNTER — Inpatient Hospital Stay (HOSPITAL_BASED_OUTPATIENT_CLINIC_OR_DEPARTMENT_OTHER): Payer: Medicare PPO | Admitting: Internal Medicine

## 2021-02-24 ENCOUNTER — Inpatient Hospital Stay: Payer: Medicare PPO

## 2021-02-24 ENCOUNTER — Inpatient Hospital Stay: Payer: Medicare PPO | Attending: Internal Medicine

## 2021-02-24 ENCOUNTER — Other Ambulatory Visit: Payer: Self-pay

## 2021-02-24 VITALS — BP 131/67 | HR 72 | Temp 97.8°F | Resp 22

## 2021-02-24 DIAGNOSIS — Z79899 Other long term (current) drug therapy: Secondary | ICD-10-CM | POA: Diagnosis not present

## 2021-02-24 DIAGNOSIS — Z7982 Long term (current) use of aspirin: Secondary | ICD-10-CM | POA: Insufficient documentation

## 2021-02-24 DIAGNOSIS — C9110 Chronic lymphocytic leukemia of B-cell type not having achieved remission: Secondary | ICD-10-CM

## 2021-02-24 DIAGNOSIS — C61 Malignant neoplasm of prostate: Secondary | ICD-10-CM | POA: Diagnosis not present

## 2021-02-24 DIAGNOSIS — I5032 Chronic diastolic (congestive) heart failure: Secondary | ICD-10-CM | POA: Diagnosis not present

## 2021-02-24 DIAGNOSIS — J449 Chronic obstructive pulmonary disease, unspecified: Secondary | ICD-10-CM | POA: Insufficient documentation

## 2021-02-24 LAB — COMPREHENSIVE METABOLIC PANEL
ALT: 14 U/L (ref 0–44)
AST: 19 U/L (ref 15–41)
Albumin: 3.9 g/dL (ref 3.5–5.0)
Alkaline Phosphatase: 73 U/L (ref 38–126)
Anion gap: 5 (ref 5–15)
BUN: 36 mg/dL — ABNORMAL HIGH (ref 8–23)
CO2: 30 mmol/L (ref 22–32)
Calcium: 9.1 mg/dL (ref 8.9–10.3)
Chloride: 99 mmol/L (ref 98–111)
Creatinine, Ser: 1.2 mg/dL (ref 0.61–1.24)
GFR, Estimated: 58 mL/min — ABNORMAL LOW (ref >60)
Glucose, Bld: 91 mg/dL (ref 70–99)
Potassium: 4.5 mmol/L (ref 3.5–5.1)
Sodium: 134 mmol/L — ABNORMAL LOW (ref 135–145)
Total Bilirubin: 0.5 mg/dL (ref 0.3–1.2)
Total Protein: 6.5 g/dL (ref 6.5–8.1)

## 2021-02-24 LAB — CBC WITH DIFFERENTIAL/PLATELET
Abs Immature Granulocytes: 0.06 10*3/uL (ref 0.00–0.07)
Basophils Absolute: 0.1 10*3/uL (ref 0.0–0.1)
Basophils Relative: 0 %
Eosinophils Absolute: 0.3 10*3/uL (ref 0.0–0.5)
Eosinophils Relative: 1 %
HCT: 37.5 % — ABNORMAL LOW (ref 39.0–52.0)
Hemoglobin: 12.2 g/dL — ABNORMAL LOW (ref 13.0–17.0)
Immature Granulocytes: 0 %
Lymphocytes Relative: 60 %
Lymphs Abs: 16.1 10*3/uL — ABNORMAL HIGH (ref 0.7–4.0)
MCH: 28.8 pg (ref 26.0–34.0)
MCHC: 32.5 g/dL (ref 30.0–36.0)
MCV: 88.7 fL (ref 80.0–100.0)
Monocytes Absolute: 5.8 10*3/uL — ABNORMAL HIGH (ref 0.1–1.0)
Monocytes Relative: 21 %
Neutro Abs: 4.7 10*3/uL (ref 1.7–7.7)
Neutrophils Relative %: 18 %
Platelets: 135 10*3/uL — ABNORMAL LOW (ref 150–400)
RBC: 4.23 MIL/uL (ref 4.22–5.81)
RDW: 14.1 % (ref 11.5–15.5)
Smear Review: NORMAL
WBC: 27.1 10*3/uL — ABNORMAL HIGH (ref 4.0–10.5)
nRBC: 0 % (ref 0.0–0.2)

## 2021-02-24 MED ORDER — LEUPROLIDE ACETATE (6 MONTH) 45 MG ~~LOC~~ KIT
45.0000 mg | PACK | Freq: Once | SUBCUTANEOUS | Status: AC
  Administered 2021-02-24: 45 mg via SUBCUTANEOUS
  Filled 2021-02-24: qty 45

## 2021-02-24 NOTE — Assessment & Plan Note (Addendum)
#  [name] [phenotype based on flow cytometry];  white count -29;  hemoglobin 13; platelets 1500s. PET scan- May 2022-no evidence of any lymphadenopathy-suggestive of active [name].Marland Kitchen  Borderline enlarged spleen/normal metabolic activity-no obvious evidence of progressive disease from underlying [name].  Continue surveillance.  #New diagnosis of prostate cancer: [AUG 2022]-Gleason score:12/12 cores were positive for Gleason 4+5/5+4 adenocarcinoma.  We discussed that patient is not a candidate for definitive therapy surgery or radiation.  Recommend palliative option of ADT.   Again discussed at length the importance of starting ADT-to treat his progressive prostate cancer.  I reviewed the mechanism of action of ADT/blocking testosterone.  Again reviewed the potential side effects including but not limited to-fatigue hot flashes loss of libido.  Also reviewed that bone health/cardiovascular as long-term complications.  I would recommend Eligard q 31M.   Understands treatments are palliative not curative. Discussed treatments are in general indefinite; however treatment breaks were given based upon tolerance preference.    # Chronic bronchitis/emphysema-chest x-ray April 2022 negative for any acute process; however patient not using nebulizer/because of cost issues.  - STABLE.  #Weight loss/ongoing fatigue-improved.   # DISPOSITION: # eligard today # follow up as planned in [place]-  MD; labs- cbc/cmp-; PSA- Dr.B   # 40 minutes face-to-face with the patient discussing the above plan of care; more than 50% of time spent on prognosis/ natural history; counseling and coordination.

## 2021-02-24 NOTE — Progress Notes (Signed)
[name] NOTE  Patient Care Team: Spencer Bon, NP as PCP - General (Nurse Practitioner)  CHIEF COMPLAINTS/PURPOSE OF CONSULTATION: ANEMIA  Oncology History Overview Note  #April-MAY 2022-[name] [phenotype based on flow cytometry]; absolute neutrophil count 7000; white count 12,000 hemoglobin 13; platelets 120s. PET scan- May 2022-no evidence of any lymphadenopathy-suggestive of active [name].Marland Kitchen  Borderline enlarged spleen/normal metabolic activity; see discussion regarding left kidney [see below].   # AUG 2022-prostate adenocarcinoma Gleason score 4+5 [12 out of 12 cores]; Spencer Buck- SEP 12th, 2022-Eligard every 6 months  # COPD/active smoker; CAD; hx of urinary obstruction [Spencer Buck]; ? Left kidney UPJ obstruction ----------------------------------  HEMATOLOGY HISTORY:  # April 2022- [name] [phenotype flow cytometry]; US-splenomegaly mild.  Absolute neutrophil count 7000; hemoglobin 13; platelets 120s.   # COPD at night O2; active smoker [1-1.5ppd]; CAD/ right bundle branch block, and peripheral vascular disease [Spencer Buck]   [name] (chronic lymphocytic leukemia) ([place])  10/10/2020 Initial Diagnosis   [name] (chronic lymphocytic leukemia) ([place])   10/22/2020 Cancer Staging   Staging form: Chronic Lymphocytic Leukemia / Small Lymphocytic Lymphoma, AJCC 8th Edition - Clinical: Modified Rai Stage II (Modified Rai risk: Intermediate, Lymphocytosis: Present, Adenopathy: Absent, Organomegaly: Present, Anemia: Absent, Thrombocytopenia: Absent) - Signed by Spencer Sickle, MD on 10/22/2020 Stage prefix: Initial diagnosis      HISTORY OF PRESENTING ILLNESS: Elderly male patient.  Accompanied by his daughter, Spencer Buck.  Spencer Buck 87 y.o.  male [name] currently on surveillance; he is here for new diagnosis of prostate cancer.  Patient underwent recent biopsy of his prostate with Spencer Buck revealed prostate adenocarcinoma.  Patient has been referred to [place] for  further evaluation recommendations.  Patient's appetite is improving.  He has not lost any weight.  No nausea no vomiting.  No fevers or chills.  No lumps or bumps.  He has been doing the straight cath every other day.  Review of Systems  Constitutional:  Positive for malaise/fatigue and weight loss. Negative for chills, diaphoresis and fever.  HENT:  Negative for nosebleeds and sore throat.   Eyes:  Negative for double vision.  Respiratory:  Negative for cough, hemoptysis, sputum production, shortness of breath and wheezing.   Cardiovascular:  Negative for chest pain, palpitations, orthopnea and leg swelling.  Gastrointestinal:  Negative for abdominal pain, blood in stool, constipation, diarrhea, heartburn, melena, nausea and vomiting.  Genitourinary:  Negative for dysuria, frequency and urgency.  Musculoskeletal:  Positive for back pain and joint pain.  Skin: Negative.  Negative for itching and rash.  Neurological:  Negative for dizziness, tingling, focal weakness, weakness and headaches.  Endo/Heme/Allergies:  Does not bruise/bleed easily.  Psychiatric/Behavioral:  Negative for depression. The patient is not nervous/anxious and does not have insomnia.    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)    Nonrheumatic aortic valve disorder    PVD (peripheral vascular disease) (HCC)    Right bundle branch block    Splenomegaly    Urinary retention     SURGICAL HISTORY: Past Surgical History:  Procedure Laterality Date  HERNIA REPAIR Left    KNEE SURGERY Left    torn ligament    PROSTATE BIOPSY N/A 01/28/2021   Procedure: PROSTATE BIOPSY;  Surgeon: Spencer Sons, MD;  Location: [facility] ORS;  Service: Urology;  Laterality: N/A;   TRANSRECTAL ULTRASOUND N/A 01/28/2021   Procedure: TRANSRECTAL ULTRASOUND;  Surgeon: Spencer Sons, MD;  Location: [facility] ORS;  Service: Urology;  Laterality: N/A;  Spoke w/Spencer Buck in ultrasound confirmed    SOCIAL HISTORY: Social History   Socioeconomic History   Marital status: Married    Spouse name: Not on file   Number of children: Not on file   Years of education: Not on file   Highest education level: Not on file  Occupational History   Not on file  Tobacco Use   Smoking status: Every Day    Packs/day: 1.50    Years: 72.00    Pack years: 108.00    Types: Cigarettes   Smokeless tobacco: Never  Substance and Sexual Activity   Alcohol use: Yes    Comment: rarely   Drug use: Never   Sexual activity: Yes  Other Topics Concern   Not on file  Social History Narrative   Not on file   Social Determinants of Health   Financial Resource Strain: Not on file  Food Insecurity: Not on file  Transportation Needs: Not on file  Physical Activity: Not on file  Stress: Not on file  Social Connections: Not on file  Intimate Partner Violence: Not on file    FAMILY HISTORY: Family History  Problem Relation Age of Onset   Stroke Paternal Grandmother    Glaucoma Other    Diabetes Other     ALLERGIES:  has No Known Allergies.  MEDICATIONS:  Current Outpatient Medications  Medication Sig Dispense Refill   albuterol (ACCUNEB) 1.25 MG/3ML nebulizer solution Take 1 ampule by nebulization every 6 (six) hours as needed for wheezing.     aspirin 81 MG EC tablet Chew 81 mg by mouth at bedtime.     Budeson-Glycopyrrol-Formoterol (BREZTRI AEROSPHERE) 160-9-4.8 MCG/ACT AERO Inhale into the lungs.     Cholecalciferol (VITAMIN D) 125 MCG (5000 UT) CAPS Take 5,000 Units by mouth daily.     [employer] CRANBERRY PO Take by mouth.     finasteride  (PROSCAR) 5 MG tablet Take 5 mg by mouth daily.     MAGNESIUM GLYCINATE PO Take 500 mg by mouth daily.     Multiple Vitamin (MULTIVITAMIN) tablet Take 1 tablet by mouth daily.     tamsulosin (FLOMAX) 0.4 MG CAPS capsule Take 0.4 mg by mouth in the morning and at bedtime.     No current facility-administered medications for this visit.      PHYSICAL EXAMINATION:   Vitals:   02/24/21 0956  BP: 131/67  Pulse: 72  Resp: (!) 22  Temp: 97.8 F (36.6 C)  SpO2: 99%   There were no vitals filed for this visit.   Physical Exam Constitutional:      Comments: Elderly male patient ambulating independently.  Accompanied by his daughter  HENT:     Head: Normocephalic and atraumatic.     Mouth/Throat:     Pharynx: No oropharyngeal exudate.  Eyes:     Pupils: Pupils are equal, round, and reactive to light.  Cardiovascular:     Rate and Rhythm: Normal rate and regular rhythm.  Pulmonary:     Effort: No respiratory distress.     Breath sounds: No wheezing.  Comments: Decreased air entry bilaterally.   Abdominal:     General: Bowel sounds are normal. There is no distension.     Palpations: Abdomen is soft. There is no mass.     Tenderness: There is no abdominal tenderness. There is no guarding or rebound.     Comments: No obvious splenomegaly noted.  Musculoskeletal:        General: No tenderness. Normal range of motion.     Cervical back: Normal range of motion and neck supple.  Skin:    General: Skin is warm.  Neurological:     Mental Status: He is alert and oriented to person, place, and time.  Psychiatric:        Mood and Affect: Affect normal.    LABORATORY DATA:  I have reviewed the data as listed Lab Results  Component Value Date   WBC 27.1 (H) 02/24/2021   HGB 12.2 (L) 02/24/2021   HCT 37.5 (L) 02/24/2021   MCV 88.7 02/24/2021   PLT 135 (L) 02/24/2021   Recent Labs    03/09/20 1433 10/03/20 1225 10/21/20 0936 11/18/20 1114 02/24/21 0928  NA 133* 135 138  139 134*  K 4.9 4.8 4.0 4.8 4.5  CL 100 99 100 102 99  CO2 23 26 32 22 30  GLUCOSE 99 83 90 58* 91  BUN 31* 45* 32* 31* 36*  CREATININE 1.93* 1.27* 1.17 1.38* 1.20  CALCIUM 9.1 9.6 8.8* 9.2 9.1  GFRNONAA 30* 55* >60  --  58*  GFRAA 35*  --   --   --   --   PROT 7.0 7.2  --   --  6.5  ALBUMIN 4.4 4.0  --   --  3.9  AST 26 26  --   --  19  ALT 16 19  --   --  14  ALKPHOS 79 82  --   --  73  BILITOT 0.7 0.7  --   --  0.5     [place] Transrectal Complete  Result Date: 01/28/2021 Please see Notes tab for imaging impression.  US Guided Needle Placement  Result Date: 02/19/2021 CLINICAL DATA:  Ultrasound was provided for use by the ordering physician.  No provider Interpretation or professional fees incurred.    [place] Intraoperative  Result Date: 02/19/2021 CLINICAL DATA:  Ultrasound was provided for use by the ordering physician.  No provider Interpretation or professional fees incurred.    [place] PROSTATE BIOPSY MULTIPLE  Result Date: 01/28/2021 Please see Notes tab for imaging impression.    [name] (chronic lymphocytic leukemia) (HCC) #[name] [phenotype based on flow cytometry];  white count -29;  hemoglobin 13; platelets 1500s. PET scan- May 2022-no evidence of any lymphadenopathy-suggestive of active [name].Marland Kitchen  Borderline enlarged spleen/normal metabolic activity-no obvious evidence of progressive disease from underlying [name].  Continue surveillance.  #New diagnosis of prostate cancer: [AUG 2022]-Gleason score:12/12 cores were positive for Gleason 4+5/5+4 adenocarcinoma.  We discussed that patient is not a candidate for definitive therapy surgery or radiation.  Recommend palliative option of ADT.   Again discussed at length the importance of starting ADT-to treat his progressive prostate cancer.  I reviewed the mechanism of action of ADT/blocking testosterone.  Again reviewed the potential side effects including but not limited to-fatigue hot flashes loss of libido.  Also reviewed that bone  health/cardiovascular as long-term complications.  I would recommend Eligard q 45M.   Understands treatments are palliative not curative. Discussed treatments are in general indefinite; however treatment breaks were  given based upon tolerance preference.    # Chronic bronchitis/emphysema-chest x-ray April 2022 negative for any acute process; however patient not using nebulizer/because of cost issues.  - STABLE.  #Weight loss/ongoing fatigue-improved.   # DISPOSITION: # eligard today # follow up as planned in [place]-  MD; labs- cbc/cmp-; PSA- SpencerB   # 40 minutes face-to-face with the patient discussing the above plan of care; more than 50% of time spent on prognosis/ natural history; counseling and coordination.  All questions were answered. The patient knows to call the clinic with any problems, questions or concerns.   Spencer Sickle, MD 02/25/2021 12:11 AM

## 2021-02-24 NOTE — Progress Notes (Signed)
Patient here today for follow up. No complaints at this time.

## 2021-02-25 ENCOUNTER — Encounter: Payer: Self-pay | Admitting: Internal Medicine

## 2021-03-28 ENCOUNTER — Telehealth: Payer: Self-pay | Admitting: *Deleted

## 2021-03-28 NOTE — Telephone Encounter (Signed)
Call returned to daughter Joycelyn Schmid and advised that she needs to contact patient PCP. She agreed to do so

## 2021-03-28 NOTE — Telephone Encounter (Signed)
Spencer Buck- Dr. B advises that patient contact his pcp to address this concern. He has only received Eligard for his prostate cancer. Please call patient to let him know. Thanks.

## 2021-03-28 NOTE — Telephone Encounter (Signed)
Patient daughter Spencer Buck called reporting that patient is having bile feet swelling and that she has been putting compression socks on him which are not helping. She states that he does eat a lot of salt and that she can do nothing about that. She is asking if he needs a fluid pill. Please advise

## 2021-03-31 ENCOUNTER — Other Ambulatory Visit: Payer: Self-pay | Admitting: Adult Health

## 2021-03-31 ENCOUNTER — Ambulatory Visit
Admission: RE | Admit: 2021-03-31 | Discharge: 2021-03-31 | Disposition: A | Discharge status: Home / Self Care | Payer: Medicare PPO | Source: Ambulatory Visit | Attending: Adult Health | Admitting: Adult Health

## 2021-03-31 ENCOUNTER — Other Ambulatory Visit: Payer: Self-pay

## 2021-03-31 DIAGNOSIS — M79604 Pain in right leg: Secondary | ICD-10-CM | POA: Diagnosis present

## 2021-04-21 ENCOUNTER — Inpatient Hospital Stay: Payer: Medicare PPO | Admitting: Internal Medicine

## 2021-04-21 ENCOUNTER — Other Ambulatory Visit: Payer: Self-pay | Admitting: *Deleted

## 2021-04-21 ENCOUNTER — Other Ambulatory Visit: Payer: Self-pay

## 2021-04-21 ENCOUNTER — Inpatient Hospital Stay: Payer: Medicare PPO | Attending: Internal Medicine

## 2021-04-21 ENCOUNTER — Encounter: Payer: Self-pay | Admitting: Internal Medicine

## 2021-04-21 DIAGNOSIS — L03115 Cellulitis of right lower limb: Secondary | ICD-10-CM | POA: Insufficient documentation

## 2021-04-21 DIAGNOSIS — N183 Chronic kidney disease, stage 3 unspecified: Secondary | ICD-10-CM | POA: Insufficient documentation

## 2021-04-21 DIAGNOSIS — Z79899 Other long term (current) drug therapy: Secondary | ICD-10-CM | POA: Insufficient documentation

## 2021-04-21 DIAGNOSIS — C9110 Chronic lymphocytic leukemia of B-cell type not having achieved remission: Secondary | ICD-10-CM | POA: Diagnosis present

## 2021-04-21 DIAGNOSIS — N138 Other obstructive and reflux uropathy: Secondary | ICD-10-CM | POA: Diagnosis not present

## 2021-04-21 DIAGNOSIS — F17210 Nicotine dependence, cigarettes, uncomplicated: Secondary | ICD-10-CM | POA: Diagnosis not present

## 2021-04-21 DIAGNOSIS — N401 Enlarged prostate with lower urinary tract symptoms: Secondary | ICD-10-CM | POA: Insufficient documentation

## 2021-04-21 DIAGNOSIS — R232 Flushing: Secondary | ICD-10-CM | POA: Insufficient documentation

## 2021-04-21 DIAGNOSIS — C61 Malignant neoplasm of prostate: Secondary | ICD-10-CM | POA: Diagnosis present

## 2021-04-21 DIAGNOSIS — Z7982 Long term (current) use of aspirin: Secondary | ICD-10-CM | POA: Insufficient documentation

## 2021-04-21 LAB — CBC WITH DIFFERENTIAL/PLATELET
Abs Immature Granulocytes: 0 10*3/uL (ref 0.00–0.07)
Band Neutrophils: 0 %
Basophils Absolute: 0 10*3/uL (ref 0.0–0.1)
Basophils Relative: 0 %
Blasts: 0 %
Eosinophils Absolute: 0.3 10*3/uL (ref 0.0–0.5)
Eosinophils Relative: 1 %
HCT: 37.5 % — ABNORMAL LOW (ref 39.0–52.0)
Hemoglobin: 12.1 g/dL — ABNORMAL LOW (ref 13.0–17.0)
Lymphocytes Relative: 83 %
Lymphs Abs: 21.9 10*3/uL — ABNORMAL HIGH (ref 0.7–4.0)
MCH: 28.3 pg (ref 26.0–34.0)
MCHC: 32.3 g/dL (ref 30.0–36.0)
MCV: 87.6 fL (ref 80.0–100.0)
Metamyelocytes Relative: 0 %
Monocytes Absolute: 1.3 10*3/uL — ABNORMAL HIGH (ref 0.1–1.0)
Monocytes Relative: 5 %
Myelocytes: 0 %
Neutro Abs: 2.9 10*3/uL (ref 1.7–7.7)
Neutrophils Relative %: 11 %
Other: 0 %
Platelets: 130 10*3/uL — ABNORMAL LOW (ref 150–400)
Promyelocytes Relative: 0 %
RBC: 4.28 MIL/uL (ref 4.22–5.81)
RDW: 13.9 % (ref 11.5–15.5)
Smear Review: DECREASED
WBC: 26.4 10*3/uL — ABNORMAL HIGH (ref 4.0–10.5)
nRBC: 0 /100 WBC
nRBC: 0.1 % (ref 0.0–0.2)

## 2021-04-21 LAB — COMPREHENSIVE METABOLIC PANEL
ALT: 15 U/L (ref 0–44)
AST: 21 U/L (ref 15–41)
Albumin: 3.9 g/dL (ref 3.5–5.0)
Alkaline Phosphatase: 76 U/L (ref 38–126)
Anion gap: 6 (ref 5–15)
BUN: 32 mg/dL — ABNORMAL HIGH (ref 8–23)
CO2: 31 mmol/L (ref 22–32)
Calcium: 9.2 mg/dL (ref 8.9–10.3)
Chloride: 98 mmol/L (ref 98–111)
Creatinine, Ser: 1.3 mg/dL — ABNORMAL HIGH (ref 0.61–1.24)
GFR, Estimated: 53 mL/min — ABNORMAL LOW (ref >60)
Glucose, Bld: 94 mg/dL (ref 70–99)
Potassium: 4.2 mmol/L (ref 3.5–5.1)
Sodium: 135 mmol/L (ref 135–145)
Total Bilirubin: 0.6 mg/dL (ref 0.3–1.2)
Total Protein: 7 g/dL (ref 6.5–8.1)

## 2021-04-21 LAB — PSA: Prostatic Specific Antigen: 0.86 ng/mL (ref 0.00–4.00)

## 2021-04-21 NOTE — Progress Notes (Signed)
Patient here for oncology follow-up appointment, concerns of Chronic SOB and low BP

## 2021-04-21 NOTE — Assessment & Plan Note (Addendum)
#  [name] [phenotype based on flow cytometry]- PET scan- May 2022-no evidence of any lymphadenopathy. No obvious evidence of progressive disease from underlying [name].   Stable white count around 26,000 normal hemoglobin/platelets.  Clinically stable asymptomatic.  Continue surveillance  # Prostate cancer: [AUG 2022]-Gleason score:12/12 cores were positive for Gleason 4+5/5+4 adenocarcinoma. [not a candidate for definitive therapy surgery or radiation].  Baseline PSA 23-.  On  palliative option of ADT; again due for Eligard mid march, 2023.  Tolerating well except for hot flashes.  NOV, 2022- PSA - 0.86.  #Borderline low blood pressure-recommend increase fluid intake.  Patient not on any antihypertensives.  #Hot flashes-grade 1-2 from Eligard.  #Fatigue-grade 1-2 from Eligard; recommend continued physical activity/offered evaluation with OT declines.  # CKD stage III-stable [chronic urinary obstruction-straight cathing].  # Cellulitis- R light LE; agree with antibiotics.  Recommend starting ASAP.  # DISPOSITION: RN will call daughter.  Print out of labs # follow up in mid-march 2022; MD; labs- cbc/cmp;PSA; eliagrd- Dr.B  Pcp:

## 2021-04-21 NOTE — Progress Notes (Signed)
Spencer Buck NOTE  Patient Care Team: Gae Bon, NP as PCP - General (Nurse Practitioner)  CHIEF COMPLAINTS/PURPOSE OF CONSULTATION: ANEMIA  Oncology History Overview Note  #April-MAY 2022-[name] [phenotype based on flow cytometry]; absolute neutrophil count 7000; white count 12,000 hemoglobin 13; platelets 120s. PET scan- May 2022-no evidence of any lymphadenopathy-suggestive of active [name].Marland Kitchen  Borderline enlarged spleen/normal metabolic activity; see discussion regarding left kidney [see below].   # AUG 2022-prostate adenocarcinoma Gleason score 4+5 [12 out of 12 cores]; Dr.Stoioff- SEP 12th, 2022-Eligard every 6 months  # COPD/active smoker; CAD; hx of urinary obstruction [Dr.Stoioff]; ? Left kidney UPJ obstruction ----------------------------------  HEMATOLOGY HISTORY:  # April 2022- [name] [phenotype flow cytometry]; US-splenomegaly mild.  Absolute neutrophil count 7000; hemoglobin 13; platelets 120s.   # COPD at night O2; active smoker [1-1.5ppd]; CAD/ right bundle branch block, and peripheral vascular disease [Dr.Parashoes]   [name] (chronic lymphocytic leukemia) ([place])  10/10/2020 Initial Diagnosis   [name] (chronic lymphocytic leukemia) ([place])   10/22/2020 Cancer Staging   Staging form: Chronic Lymphocytic Leukemia / Small Lymphocytic Lymphoma, AJCC 8th Edition - Clinical: Modified Rai Stage II (Modified Rai risk: Intermediate, Lymphocytosis: Present, Adenopathy: Absent, Organomegaly: Present, Anemia: Absent, Thrombocytopenia: Absent) - Signed by Cammie Sickle, MD on 10/22/2020 Stage prefix: Initial diagnosis       HISTORY OF PRESENTING ILLNESS: Elderly male patient.  Accompanied by his daughter, Spencer Buck.  Spencer Buck 87 y.o.  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  Patient recently diagnosed with right lower extremity cellulitis.  No fever no chills.  No nausea or  vomiting.  Complains of fatigue.  Mild hot flashes.  Since evaluation with urology-he has not been needing to do straight cath every day.  Review of Systems  Constitutional:  Positive for malaise/fatigue and weight loss. Negative for chills, diaphoresis and fever.  HENT:  Negative for nosebleeds and sore throat.   Eyes:  Negative for double vision.  Respiratory:  Negative for cough, hemoptysis, sputum production, shortness of breath and wheezing.   Cardiovascular:  Negative for chest pain, palpitations, orthopnea and leg swelling.  Gastrointestinal:  Negative for abdominal pain, blood in stool, constipation, diarrhea, heartburn, melena, nausea and vomiting.  Genitourinary:  Negative for dysuria, frequency and urgency.  Musculoskeletal:  Positive for back pain and joint pain.  Skin: Negative.  Negative for itching and rash.  Neurological:  Negative for dizziness, tingling, focal weakness, weakness and headaches.  Endo/Heme/Allergies:  Does not bruise/bleed easily.  Psychiatric/Behavioral:  Negative for depression. The patient is not nervous/anxious and does not have insomnia.    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)    Nonrheumatic aortic valve disorder    PVD (peripheral vascular disease) (HCC)    Right bundle branch block    Splenomegaly    Urinary retention     SURGICAL HISTORY: Past Surgical History:  Procedure Laterality Date   HERNIA REPAIR Left    KNEE SURGERY Left    torn ligament   PROSTATE BIOPSY N/A  01/28/2021   Procedure: PROSTATE BIOPSY;  Surgeon: Abbie Sons, MD;  Location: [facility] ORS;  Service: Urology;  Laterality: N/A;   TRANSRECTAL ULTRASOUND N/A 01/28/2021   Procedure: TRANSRECTAL ULTRASOUND;  Surgeon: Abbie Sons, MD;  Location: [facility] ORS;  Service: Urology;  Laterality: N/A;  Spoke w/[name] in ultrasound confirmed    SOCIAL HISTORY: Social History   Socioeconomic History   Marital status: Married    Spouse name: Not on file   Number of children: Not on file   Years of education: Not on file   Highest education level: Not on file  Occupational History   Not on file  Tobacco Use   Smoking status: Every Day    Packs/day: 1.50    Years: 72.00    Pack years: 108.00    Types: Cigarettes   Smokeless tobacco: Never  Substance and Sexual Activity   Alcohol use: Yes    Comment: rarely   Drug use: Never   Sexual activity: Yes  Other Topics Concern   Not on file  Social History Narrative   Not on file   Social Determinants of Health   Financial Resource Strain: Not on file  Food Insecurity: Not on file  Transportation Needs: Not on file  Physical Activity: Not on file  Stress: Not on file  Social Connections: Not on file  Intimate Partner Violence: Not on file    FAMILY HISTORY: Family History  Problem Relation Age of Onset   Stroke Paternal Grandmother    Glaucoma Other    Diabetes Other     ALLERGIES:  has No Known Allergies.  MEDICATIONS:  Current Outpatient Medications  Medication Sig Dispense Refill   albuterol (ACCUNEB) 1.25 MG/3ML nebulizer solution Take 1 ampule by nebulization every 6 (six) hours as needed for wheezing.     aspirin 81 MG EC tablet Chew 81 mg by mouth at bedtime.     Budeson-Glycopyrrol-Formoterol (BREZTRI AEROSPHERE) 160-9-4.8 MCG/ACT AERO Inhale into the lungs.     Cholecalciferol (VITAMIN D) 125 MCG (5000 UT) CAPS Take 5,000 Units by mouth daily.     [employer]S CRANBERRY PO Take by mouth.     finasteride (PROSCAR) 5 MG tablet Take 5 mg by mouth daily.     MAGNESIUM GLYCINATE PO Take 500 mg by  mouth daily.     Multiple Vitamin (MULTIVITAMIN) tablet Take 1 tablet by mouth daily.     tamsulosin (FLOMAX) 0.4 MG CAPS capsule Take 0.4 mg by mouth in the morning and at bedtime.     No current facility-administered medications for this visit.      PHYSICAL EXAMINATION:   Vitals:   04/21/21 1107  BP: (!) 94/54  Pulse: 64  Resp: 19  Temp: (!) 97.5 F (36.4 C)  SpO2: 98%   Filed Weights   04/21/21 1107  Weight: 152 lb (68.9 kg)     Physical Exam Constitutional:      Comments: Elderly male patient ambulating independently.  Accompanied by his daughter  HENT:     Head: Normocephalic and atraumatic.     Mouth/Throat:     Pharynx: No oropharyngeal exudate.  Eyes:     Pupils: Pupils are equal, round, and reactive to light.  Cardiovascular:     Rate and Rhythm: Normal rate and regular rhythm.  Pulmonary:     Effort: No respiratory distress.     Breath sounds: No wheezing.     Comments: Decreased air entry bilaterally.   Abdominal:  General: Bowel sounds are normal. There is no distension.     Palpations: Abdomen is soft. There is no mass.     Tenderness: There is no abdominal tenderness. There is no guarding or rebound.     Comments: No obvious splenomegaly noted.  Musculoskeletal:        General: No tenderness. Normal range of motion.     Cervical back: Normal range of motion and neck supple.  Skin:    General: Skin is warm.  Neurological:     Mental Status: He is alert and oriented to person, place, and time.  Psychiatric:        Mood and Affect: Affect normal.    LABORATORY DATA:  I have reviewed the data as listed Lab Results  Component Value Date   WBC 26.4 (H) 04/21/2021   HGB 12.1 (L) 04/21/2021   HCT 37.5 (L) 04/21/2021   MCV 87.6 04/21/2021   PLT 130 (L) 04/21/2021   Recent Labs    10/03/20 1225 10/21/20 0936 11/18/20 1114 02/24/21 0928 04/21/21 0914  NA 135 138 139 134* 135  K 4.8 4.0 4.8 4.5 4.2  CL 99 100 102 99 98  CO2 26 32 22  30 31   GLUCOSE 83 90 58* 91 94  BUN 45* 32* 31* 36* 32*  CREATININE 1.27* 1.17 1.38* 1.20 1.30*  CALCIUM 9.6 8.8* 9.2 9.1 9.2  GFRNONAA 55* >60  --  58* 53*  PROT 7.2  --   --  6.5 7.0  ALBUMIN 4.0  --   --  3.9 3.9  AST 26  --   --  19 21  ALT 19  --   --  14 15  ALKPHOS 82  --   --  73 76  BILITOT 0.7  --   --  0.5 0.6     US Venous Img Lower Unilateral Right (DVT)  Result Date: 03/31/2021 CLINICAL DATA:  Right lower extremity pain and edema past 5 days. History of prostate cancer and smoking. Evaluate for DVT. EXAM: RIGHT LOWER EXTREMITY VENOUS DOPPLER ULTRASOUND TECHNIQUE: Gray-scale sonography with graded compression, as well as color Doppler and duplex ultrasound were performed to evaluate the lower extremity deep venous systems from the level of the common femoral vein and including the common femoral, femoral, profunda femoral, popliteal and calf veins including the posterior tibial, peroneal and gastrocnemius veins when visible. The superficial great saphenous vein was also interrogated. Spectral Doppler was utilized to evaluate flow at rest and with distal augmentation maneuvers in the common femoral, femoral and popliteal veins. COMPARISON:  None. FINDINGS: Contralateral Common Femoral Vein: Respiratory phasicity is normal and symmetric with the symptomatic side. No evidence of thrombus. Normal compressibility. Common Femoral Vein: No evidence of thrombus. Normal compressibility, respiratory phasicity and response to augmentation. Saphenofemoral Junction: No evidence of thrombus. Normal compressibility and flow on color Doppler imaging. Profunda Femoral Vein: No evidence of thrombus. Normal compressibility and flow on color Doppler imaging. Femoral Vein: No evidence of thrombus. Normal compressibility, respiratory phasicity and response to augmentation. Popliteal Vein: No evidence of thrombus. Normal compressibility, respiratory phasicity and response to augmentation. Calf Veins: No  evidence of thrombus. Normal compressibility and flow on color Doppler imaging. Superficial Great Saphenous Vein: No evidence of thrombus. Normal compressibility. Venous Reflux:  None. Other Findings: Eccentric calcified plaque is noted within the right common and superficial femoral arteries. There is been a mildly prominent though non pathologically enlarged right inguinal lymph node which measures 0.9 cm in short  axis diameter and maintains a benign fatty hilum (image 10), presumably reactive in etiology. IMPRESSION: No evidence of DVT within the right lower extremity. Electronically Signed   By: Sandi Mariscal M.D.   On: 03/31/2021 17:36     [name] (chronic lymphocytic leukemia) (HCC) #[name] [phenotype based on flow cytometry]- PET scan- May 2022-no evidence of any lymphadenopathy. No obvious evidence of progressive disease from underlying [name].   Stable white count around 26,000 normal hemoglobin/platelets.  Clinically stable asymptomatic.  Continue surveillance  # Prostate cancer: [AUG 2022]-Gleason score:12/12 cores were positive for Gleason 4+5/5+4 adenocarcinoma. [not a candidate for definitive therapy surgery or radiation].  Baseline PSA 23-.  On  palliative option of ADT; again due for Eligard mid march, 2023.  Tolerating well except for hot flashes.  NOV, 2022- PSA - 0.86.  #Borderline low blood pressure-recommend increase fluid intake.  Patient not on any antihypertensives.  #Hot flashes-grade 1-2 from Eligard.  #Fatigue-grade 1-2 from Eligard; recommend continued physical activity/offered evaluation with OT declines.  # CKD stage III-stable [chronic urinary obstruction-straight cathing].  # Cellulitis- R light LE; agree with antibiotics.  Recommend starting ASAP.  # DISPOSITION: RN will call daughter.  Print out of labs # follow up in mid-march 2022; MD; labs- cbc/cmp;PSA; eliagrd- Dr.B  Pcp:      All questions were answered. The patient knows to call the clinic with any problems,  questions or concerns.   Cammie Sickle, MD 04/22/2021 8:04 AM

## 2021-04-22 ENCOUNTER — Encounter: Payer: Self-pay | Admitting: Internal Medicine

## 2021-04-23 ENCOUNTER — Telehealth: Payer: Self-pay

## 2021-04-23 NOTE — Telephone Encounter (Signed)
-----   Message from Cammie Sickle, MD sent at 04/22/2021  5:39 PM EST ----- Regarding: labs Please inform the daughter Joycelyn Schmid regarding results of the PSA-0.86; improved from 13.  No new recommendations.  Follow-up as planned.  Thanks GB

## 2021-04-23 NOTE — Telephone Encounter (Signed)
Spencer Buck aware. Sent our recent labs to his PCP, Spencer Buck, as requested by daughter.

## 2021-06-13 ENCOUNTER — Ambulatory Visit: Payer: Medicare PPO | Admitting: Urology

## 2021-06-18 ENCOUNTER — Encounter: Payer: Self-pay | Admitting: Urology

## 2021-06-18 ENCOUNTER — Ambulatory Visit: Payer: Medicare PPO | Admitting: Urology

## 2021-06-18 ENCOUNTER — Other Ambulatory Visit: Payer: Self-pay

## 2021-06-18 VITALS — BP 104/57 | HR 82 | Ht 71.0 in | Wt 153.0 lb

## 2021-06-18 DIAGNOSIS — N390 Urinary tract infection, site not specified: Secondary | ICD-10-CM | POA: Diagnosis not present

## 2021-06-18 DIAGNOSIS — C61 Malignant neoplasm of prostate: Secondary | ICD-10-CM

## 2021-06-18 LAB — URINALYSIS, COMPLETE
Bilirubin, UA: NEGATIVE
Glucose, UA: NEGATIVE
Ketones, UA: NEGATIVE
Nitrite, UA: NEGATIVE
Protein,UA: NEGATIVE
Specific Gravity, UA: 1.015 (ref 1.005–1.030)
Urobilinogen, Ur: 0.2 mg/dL (ref 0.2–1.0)
pH, UA: 6.5 (ref 5.0–7.5)

## 2021-06-18 LAB — MICROSCOPIC EXAMINATION
Epithelial Cells (non renal): NONE SEEN /hpf (ref 0–10)
RBC, Urine: NONE SEEN /hpf (ref 0–2)
WBC, UA: >30 /hpf — ABNORMAL HIGH (ref 0–5)

## 2021-06-18 MED ORDER — SULFAMETHOXAZOLE-TRIMETHOPRIM 800-160 MG PO TABS: 1.0000 | ORAL_TABLET | Freq: Two times a day (BID) | ORAL | 0 refills | Status: DC

## 2021-06-18 NOTE — Progress Notes (Signed)
06/18/2021 9:39 AM   Spencer Buck 1930/11/01 [id number]  Referring provider: Gae Bon, NP [address] [place],  Spencer Buck [address]  Chief Complaint  Patient presents with   Prostate Cancer    Urologic history:  1.  Prostate cancer Biopsy August 2022; PSA 23.2/abnormal DRE 12/12 cores + Gleason 4+5/5+4 adenocarcinoma No bony metastatic disease On leuprolide and followed by medical oncology  2.  Left hydronephrosis Lasix renal scan 34% function left kidney; no evidence of obstruction  3.  History urinary retention Previously on intermittent catheterization  HPI: 88 y.o. male called for an appointment for difficulty with urination.  States after starting leuprolide he began to void much better and did not require intermittent catheterization Over the last 2 days he has had urinary hesitancy in the a.m. and cloudy urine.  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)    Nonrheumatic aortic valve disorder    PVD (peripheral vascular disease) (HCC)    Right bundle branch block    Splenomegaly    Urinary retention     Surgical History: Past Surgical History:  Procedure Laterality Date   HERNIA REPAIR Left    KNEE SURGERY Left    torn ligament   PROSTATE BIOPSY N/A 01/28/2021   Procedure: PROSTATE BIOPSY;  Surgeon: Abbie Sons, MD;  Location: [facility] ORS;  Service: Urology;  Laterality: N/A;   TRANSRECTAL ULTRASOUND N/A 01/28/2021   Procedure:  TRANSRECTAL ULTRASOUND;  Surgeon: Abbie Sons, MD;  Location: [facility];  Service: Urology;  Laterality: N/A;  Spoke w/[name] in ultrasound confirmed    Home Medications:  Allergies as of 06/18/2021   No Known Allergies      Medication List        Accurate as of June 18, 2021  9:39 AM. If you have any questions, ask your nurse or doctor.          albuterol 1.25 MG/3ML nebulizer solution Commonly known as: ACCUNEB Take 1 ampule by nebulization every 6 (six) hours as needed for wheezing.   aspirin 81 MG EC tablet Chew 81 mg by mouth at bedtime.   Breztri Aerosphere 160-9-4.8 MCG/ACT Aero Generic drug: Budeson-Glycopyrrol-Formoterol Inhale into the lungs.   [employer]S CRANBERRY PO Take by mouth.   finasteride 5 MG tablet Commonly known as: PROSCAR Take 5 mg by mouth daily.   MAGNESIUM GLYCINATE PO Take 500 mg by mouth daily.   multivitamin tablet Take 1 tablet by mouth daily.   tamsulosin 0.4 MG Caps capsule Commonly known as: FLOMAX Take 0.4 mg by mouth in the morning and at bedtime.   Vitamin D 125 MCG (5000 UT) Caps Take 5,000 Units by mouth daily.        Allergies: No Known Allergies  Family History: Family History  Problem Relation Age of Onset   Stroke Paternal Grandmother    Glaucoma Other    Diabetes Other     Social History:  reports that  he has been smoking cigarettes. He has a 108.00 pack-year smoking history. He has never used smokeless tobacco. He reports current alcohol use. He reports that he does not use drugs.   Physical Exam: BP (!) 104/57    Pulse 82    Ht 5\' 11"  (1.803 m)    Wt 153 lb (69.4 kg)    BMI 21.34 kg/m   Constitutional:  Alert and oriented, No acute distress. HEENT: [place] AT, moist mucus membranes.  Trachea midline, no masses. Cardiovascular: No clubbing, cyanosis, or edema. Respiratory: Normal respiratory effort, no increased work of breathing. Psychiatric: Normal mood and affect.  Laboratory  Data:  Urinalysis Appearance-yellow/cloudy Dipstick-trace blood/3+ leukocytes Microscopy->30 WBC/moderate bacteria  Assessment & Plan:    1.  Urinary hesitancy with UTI Urine culture Rx Septra DS twice daily x7 days Follow-up bladder scan 1 month  2.  High risk prostate cancer Last PSA 0.86 Continue medical oncology follow-up   Abbie Sons, MD  [facility] [address] [place], [place] [address] [phone]

## 2021-06-20 LAB — CULTURE, URINE COMPREHENSIVE

## 2021-08-26 ENCOUNTER — Inpatient Hospital Stay: Payer: Medicare PPO

## 2021-08-26 ENCOUNTER — Inpatient Hospital Stay: Payer: Medicare PPO | Admitting: Internal Medicine

## 2021-08-26 ENCOUNTER — Encounter: Payer: Self-pay | Admitting: Internal Medicine

## 2021-08-26 ENCOUNTER — Inpatient Hospital Stay: Payer: Medicare PPO | Attending: Internal Medicine

## 2021-08-26 ENCOUNTER — Other Ambulatory Visit: Payer: Self-pay

## 2021-08-26 DIAGNOSIS — J449 Chronic obstructive pulmonary disease, unspecified: Secondary | ICD-10-CM | POA: Insufficient documentation

## 2021-08-26 DIAGNOSIS — C9110 Chronic lymphocytic leukemia of B-cell type not having achieved remission: Secondary | ICD-10-CM | POA: Diagnosis not present

## 2021-08-26 DIAGNOSIS — Z7982 Long term (current) use of aspirin: Secondary | ICD-10-CM | POA: Diagnosis not present

## 2021-08-26 DIAGNOSIS — Z79899 Other long term (current) drug therapy: Secondary | ICD-10-CM | POA: Diagnosis not present

## 2021-08-26 DIAGNOSIS — C61 Malignant neoplasm of prostate: Secondary | ICD-10-CM | POA: Insufficient documentation

## 2021-08-26 DIAGNOSIS — N183 Chronic kidney disease, stage 3 unspecified: Secondary | ICD-10-CM | POA: Insufficient documentation

## 2021-08-26 DIAGNOSIS — F17210 Nicotine dependence, cigarettes, uncomplicated: Secondary | ICD-10-CM | POA: Diagnosis not present

## 2021-08-26 LAB — CBC WITH DIFFERENTIAL/PLATELET
Abs Immature Granulocytes: 0.05 10*3/uL (ref 0.00–0.07)
Basophils Absolute: 0.1 10*3/uL (ref 0.0–0.1)
Basophils Relative: 0 %
Eosinophils Absolute: 0.3 10*3/uL (ref 0.0–0.5)
Eosinophils Relative: 1 %
HCT: 38.9 % — ABNORMAL LOW (ref 39.0–52.0)
Hemoglobin: 12.6 g/dL — ABNORMAL LOW (ref 13.0–17.0)
Immature Granulocytes: 0 %
Lymphocytes Relative: 73 %
Lymphs Abs: 22.5 10*3/uL — ABNORMAL HIGH (ref 0.7–4.0)
MCH: 28 pg (ref 26.0–34.0)
MCHC: 32.4 g/dL (ref 30.0–36.0)
MCV: 86.4 fL (ref 80.0–100.0)
Monocytes Absolute: 4.1 10*3/uL — ABNORMAL HIGH (ref 0.1–1.0)
Monocytes Relative: 13 %
Neutro Abs: 4.2 10*3/uL (ref 1.7–7.7)
Neutrophils Relative %: 13 %
Platelets: 137 10*3/uL — ABNORMAL LOW (ref 150–400)
RBC: 4.5 MIL/uL (ref 4.22–5.81)
RDW: 13.9 % (ref 11.5–15.5)
Smear Review: NORMAL
WBC: 31.2 10*3/uL — ABNORMAL HIGH (ref 4.0–10.5)
nRBC: 0 % (ref 0.0–0.2)

## 2021-08-26 LAB — COMPREHENSIVE METABOLIC PANEL
ALT: 14 U/L (ref 0–44)
AST: 21 U/L (ref 15–41)
Albumin: 3.9 g/dL (ref 3.5–5.0)
Alkaline Phosphatase: 70 U/L (ref 38–126)
Anion gap: 6 (ref 5–15)
BUN: 39 mg/dL — ABNORMAL HIGH (ref 8–23)
CO2: 29 mmol/L (ref 22–32)
Calcium: 9.2 mg/dL (ref 8.9–10.3)
Chloride: 99 mmol/L (ref 98–111)
Creatinine, Ser: 1.28 mg/dL — ABNORMAL HIGH (ref 0.61–1.24)
GFR, Estimated: 54 mL/min — ABNORMAL LOW (ref >60)
Glucose, Bld: 143 mg/dL — ABNORMAL HIGH (ref 70–99)
Potassium: 4.1 mmol/L (ref 3.5–5.1)
Sodium: 134 mmol/L — ABNORMAL LOW (ref 135–145)
Total Bilirubin: 0.6 mg/dL (ref 0.3–1.2)
Total Protein: 6.8 g/dL (ref 6.5–8.1)

## 2021-08-26 LAB — PSA: Prostatic Specific Antigen: 0.09 ng/mL (ref 0.00–4.00)

## 2021-08-26 MED ORDER — LEUPROLIDE ACETATE (6 MONTH) 45 MG ~~LOC~~ KIT
45.0000 mg | PACK | Freq: Once | SUBCUTANEOUS | Status: AC
Start: 2021-08-26 — End: 2021-08-26
  Administered 2021-08-26: 45 mg via SUBCUTANEOUS
  Filled 2021-08-26: qty 45

## 2021-08-26 NOTE — Assessment & Plan Note (Addendum)
#  [name] [phenotype based on flow cytometry]- PET scan- May 2022-no evidence of any lymphadenopathy. No obvious evidence of progressive disease from underlying [name].    white count around  31,000 normal hemoglobin/platelets.  Overall stable clinically.  Patient continues to be asymptomatic.  Continue surveillance ? ?# Prostate cancer: [AUG 2022]-Gleason score:12/12 cores were positive for Gleason 4+5/5+4 adenocarcinoma. [not a candidate for definitive therapy surgery or radiation].  Baseline PSA 23-.  On  palliative option of ADT q6M.    Tolerating well except for hot flashes.  NOV, 2022- PSA - 0.86.  Awaiting PSA from today. ? ?#Hot flashes-grade 1-2 from Eligard- STABLE ? ?#Fatigue-grade 1-2 from Eligard;STABLE ? ?# CKD stage III-stable [chronic urinary obstruction-straight cathing-improved on Eligard].STABLE ? ?# COPD-Active smoker: STABLE.  ? ?eligard q6M ?# DISPOSITION:  ?# Eligard ?# follow up in 3 months  2022; MD; labs- cbc/cmp;PSA;Dr.B ? ?Pcp:  ? ? ? ? ?

## 2021-08-26 NOTE — Progress Notes (Signed)
[place] ?CONSULT NOTE ? ?Patient Care Team: ?Gae Bon, NP as PCP - General (Nurse Practitioner) ? ?CHIEF COMPLAINTS/PURPOSE OF CONSULTATION: ANEMIA ? ?Oncology History Overview Note  ?#April-MAY 2022-[name] [phenotype based on flow cytometry]; absolute neutrophil count 7000; white count 12,000 hemoglobin 13; platelets 120s. PET scan- May 2022-no evidence of any lymphadenopathy-suggestive of active [name].Marland Kitchen  Borderline enlarged spleen/normal metabolic activity; see discussion regarding left kidney [see below].  ? ?# AUG 2022-prostate adenocarcinoma Gleason score 4+5 [12 out of 12 cores]; Dr.Stoioff- SEP 12th, 2022-Eligard every 6 months ? ?# COPD/active smoker; CAD; hx of urinary obstruction [Dr.Stoioff]; ? Left kidney UPJ obstruction ?---------------------------------- ? ?HEMATOLOGY HISTORY: ? ?# April 2022- [name] [phenotype flow cytometry]; US-splenomegaly mild.  Absolute neutrophil count 7000; hemoglobin 13; platelets 120s.  ? ?# COPD at night O2; active smoker [1-1.5ppd]; CAD/ right bundle branch block, and peripheral vascular disease [Dr.Parashoes] ?  ?[name] (chronic lymphocytic leukemia) ([place])  ?10/10/2020 Initial Diagnosis  ? [name] (chronic lymphocytic leukemia) ([place]) ?  ?10/22/2020 Cancer Staging  ? Staging form: Chronic Lymphocytic Leukemia / Small Lymphocytic Lymphoma, AJCC 8th Edition ?- Clinical: Modified Rai Stage II (Modified Rai risk: Intermediate, Lymphocytosis: Present, Adenopathy: Absent, Organomegaly: Present, Anemia: Absent, Thrombocytopenia: Absent) - Signed by Cammie Sickle, MD on 10/22/2020 ?Stage prefix: Initial diagnosis ? ?  ? ? ? ?HISTORY OF PRESENTING ILLNESS: Elderly male patient.  Alone.  ? ?Spencer Buck 88 y.o.  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 ? ?Pt C/O having as much strength since last injection. Urination improved. States he gets tired very easily. ? ?No fever no chills.  No nausea  or vomiting.  Complains of fatigue.  Mild hot flashes. Since evaluation with urology-he has not been needing to do straight cath every day. ? ?Review of Systems  ?Constitutional:  Positive for malaise/fatigue and weight loss. Negative for chills, diaphoresis and fever.  ?HENT:  Negative for nosebleeds and sore throat.   ?Eyes:  Negative for double vision.  ?Respiratory:  Negative for cough, hemoptysis, sputum production, shortness of breath and wheezing.   ?Cardiovascular:  Negative for chest pain, palpitations, orthopnea and leg swelling.  ?Gastrointestinal:  Negative for abdominal pain, blood in stool, constipation, diarrhea, heartburn, melena, nausea and vomiting.  ?Genitourinary:  Negative for dysuria, frequency and urgency.  ?Musculoskeletal:  Positive for back pain and joint pain.  ?Skin: Negative.  Negative for itching and rash.  ?Neurological:  Negative for dizziness, tingling, focal weakness, weakness and headaches.  ?Endo/Heme/Allergies:  Does not bruise/bleed easily.  ?Psychiatric/Behavioral:  Negative for depression. The patient is not nervous/anxious and does not have insomnia.   ? ?MEDICAL HISTORY:  ?Past Medical History:  ?Diagnosis Date  ? Anemia   ? Aortic atherosclerosis ([place])   ? Biatrial enlargement   ? BPH (benign prostatic hyperplasia)   ? CAD (coronary artery disease)   ? Cardiomegaly   ? [name] (chronic lymphocytic leukemia) ([place])   ? COPD (chronic obstructive pulmonary disease) ([place])   ? Diastolic heart failure ([place])   ? Nicotine dependence, cigarettes, uncomplicated   ? 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 Nonrheumatic aortic valve disorder   ? PVD (peripheral vascular disease) ([place])   ? Right bundle branch block   ? Splenomegaly   ? Urinary retention   ? ? ?SURGICAL HISTORY: ?Past Surgical History:  ?Procedure Laterality Date  ? HERNIA REPAIR Left   ? KNEE SURGERY Left   ? torn ligament  ?  PROSTATE BIOPSY N/A 01/28/2021  ? Procedure: PROSTATE BIOPSY;  Surgeon: Abbie Sons, MD;  Location: [facility] ORS;  Service: Urology;  Laterality: N/A;  ? TRANSRECTAL ULTRASOUND N/A 01/28/2021  ? Procedure: TRANSRECTAL ULTRASOUND;  Surgeon: Abbie Sons, MD;  Location: [facility] ORS;  Service: Urology;  Laterality: N/A;  Spoke w/[name] in ultrasound confirmed  ? ? ?SOCIAL HISTORY: ?Social History  ? ?Socioeconomic History  ? Marital status: Married  ?  Spouse name: Not on file  ? Number of children: Not on file  ? Years of education: Not on file  ? Highest education level: Not on file  ?Occupational History  ? Not on file  ?Tobacco Use  ? Smoking status: Every Day  ?  Packs/day: 1.50  ?  Years: 72.00  ?  Pack years: 108.00  ?  Types: Cigarettes  ? Smokeless tobacco: Never  ?Vaping Use  ? Vaping Use: Never used  ?Substance and Sexual Activity  ? Alcohol use: Yes  ?  Comment: rarely  ? Drug use: Never  ? Sexual activity: Yes  ?Other Topics Concern  ? Not on file  ?Social History Narrative  ? Not on file  ? ?Social Determinants of Health  ? ?Financial Resource Strain: Not on file  ?Food Insecurity: Not on file  ?Transportation Needs: Not on file  ?Physical Activity: Not on file  ?Stress: Not on file  ?Social Connections: Not on file  ?Intimate Partner Violence: Not on file  ? ? ?FAMILY HISTORY: ?Family History  ?Problem Relation Age of Onset  ? Stroke Paternal Grandmother   ? Glaucoma Other   ? Diabetes Other   ? ? ?ALLERGIES:  has No Known Allergies. ? ?MEDICATIONS:  ?Current Outpatient Medications  ?Medication Sig Dispense Refill  ? albuterol (ACCUNEB) 1.25 MG/3ML nebulizer solution Take 1 ampule by nebulization every 6 (six) hours as needed for wheezing.    ? aspirin 81 MG EC tablet Chew 81 mg by mouth at bedtime.    ? Budeson-Glycopyrrol-Formoterol (BREZTRI AEROSPHERE) 160-9-4.8 MCG/ACT AERO Inhale into the lungs.    ? Cholecalciferol (VITAMIN D) 125 MCG (5000 UT) CAPS Take 5,000 Units by mouth daily.    ? [employer] CRANBERRY PO Take by mouth.    ? finasteride (PROSCAR) 5 MG tablet Take 5 mg by mouth daily.     ? MAGNESIUM GLYCINATE PO Take 500 mg by mouth daily.    ? Multiple Vitamin (MULTIVITAMIN) tablet Take 1 tablet by mouth daily.    ? sulfamethoxazole-trimethoprim (BACTRIM DS) 800-160 MG tablet Take 1 tablet by mouth every 12 (twelve) hours. 14 tablet 0  ? tamsulosin (FLOMAX) 0.4 MG CAPS capsule Take 0.4 mg by mouth in the morning and at bedtime.    ? ?No current facility-administered medications for this visit.  ? ? ? ? ?PHYSICAL EXAMINATION: ? ? ?Vitals:  ? 08/26/21 1014  ?BP: (!) 113/48  ?Pulse: 71  ?Temp: (!) 97 ?F (36.1 ?C)  ?SpO2: 97%  ? ?Filed Weights  ? 08/26/21 1014  ?Weight: 155 lb 6.4 oz (70.5 kg)  ? ? ? ?Physical Exam ?Constitutional:   ?   Comments: Elderly male patient ambulating independently.  Accompanied by his daughter  ?HENT:  ?   Head: Normocephalic and atraumatic.  ?   Mouth/Throat:  ?   Pharynx: No oropharyngeal exudate.  ?Eyes:  ?   Pupils: Pupils are equal, round, and reactive to light.  ?Cardiovascular:  ?   Rate and Rhythm: Normal rate and regular rhythm.  ?Pulmonary:  ?  Effort: No respiratory distress.  ?   Breath sounds: No wheezing.  ?   Comments: Decreased air entry bilaterally.   ?Abdominal:  ?   General: Bowel sounds are normal. There is no distension.  ?   Palpations: Abdomen is soft. There is no mass.  ?   Tenderness: There is no abdominal tenderness. There is no guarding or rebound.  ?   Comments: No obvious splenomegaly noted.  ?Musculoskeletal:     ?   General: No tenderness. Normal range of motion.  ?   Cervical back: Normal range of motion and neck supple.  ?Skin: ?   General: Skin is warm.  ?Neurological:  ?   Mental Status: He is alert and oriented to person, place, and time.  ?Psychiatric:     ?   Mood and Affect: Affect normal.  ? ? ?LABORATORY DATA:  ?I have reviewed the data as listed ?Lab Results  ?Component Value Date  ? WBC 31.2 (H) 08/26/2021  ? HGB 12.6 (L) 08/26/2021  ? HCT 38.9 (L) 08/26/2021  ? MCV 86.4 08/26/2021  ? PLT 137 (L) 08/26/2021  ? ?Recent Labs  ?   02/24/21 ?0928 04/21/21 ?0914 08/26/21 ?0945  ?NA 134* 135 134*  ?K 4.5 4.2 4.1  ?CL 99 98 99  ?CO2 '30 31 29  '$ ?GLUCOSE 91 94 143*  ?BUN 36* 32* 39*  ?CREATININE 1.20 1.30* 1.28*  ?CALCIUM 9.1 9.2 9.2  ?

## 2021-08-26 NOTE — Progress Notes (Signed)
Pt C/O having as much strength since last injection. States he gets tired very easily. ?

## 2021-11-26 ENCOUNTER — Inpatient Hospital Stay: Payer: Medicare PPO | Attending: Internal Medicine

## 2021-11-26 ENCOUNTER — Other Ambulatory Visit: Payer: Self-pay

## 2021-11-26 ENCOUNTER — Inpatient Hospital Stay: Payer: Medicare PPO | Admitting: Internal Medicine

## 2021-11-26 DIAGNOSIS — R001 Bradycardia, unspecified: Secondary | ICD-10-CM | POA: Diagnosis not present

## 2021-11-26 DIAGNOSIS — C9110 Chronic lymphocytic leukemia of B-cell type not having achieved remission: Secondary | ICD-10-CM | POA: Diagnosis present

## 2021-11-26 DIAGNOSIS — J449 Chronic obstructive pulmonary disease, unspecified: Secondary | ICD-10-CM | POA: Diagnosis not present

## 2021-11-26 DIAGNOSIS — Z79899 Other long term (current) drug therapy: Secondary | ICD-10-CM | POA: Diagnosis not present

## 2021-11-26 DIAGNOSIS — F17210 Nicotine dependence, cigarettes, uncomplicated: Secondary | ICD-10-CM | POA: Insufficient documentation

## 2021-11-26 DIAGNOSIS — C61 Malignant neoplasm of prostate: Secondary | ICD-10-CM | POA: Diagnosis present

## 2021-11-26 DIAGNOSIS — N183 Chronic kidney disease, stage 3 unspecified: Secondary | ICD-10-CM | POA: Insufficient documentation

## 2021-11-26 DIAGNOSIS — Z7982 Long term (current) use of aspirin: Secondary | ICD-10-CM | POA: Insufficient documentation

## 2021-11-26 LAB — COMPREHENSIVE METABOLIC PANEL
ALT: 13 U/L (ref 0–44)
AST: 19 U/L (ref 15–41)
Albumin: 3.8 g/dL (ref 3.5–5.0)
Alkaline Phosphatase: 76 U/L (ref 38–126)
Anion gap: 7 (ref 5–15)
BUN: 35 mg/dL — ABNORMAL HIGH (ref 8–23)
CO2: 29 mmol/L (ref 22–32)
Calcium: 9.1 mg/dL (ref 8.9–10.3)
Chloride: 100 mmol/L (ref 98–111)
Creatinine, Ser: 1.34 mg/dL — ABNORMAL HIGH (ref 0.61–1.24)
GFR, Estimated: 51 mL/min — ABNORMAL LOW (ref >60)
Glucose, Bld: 83 mg/dL (ref 70–99)
Potassium: 4.4 mmol/L (ref 3.5–5.1)
Sodium: 136 mmol/L (ref 135–145)
Total Bilirubin: 0.6 mg/dL (ref 0.3–1.2)
Total Protein: 6.9 g/dL (ref 6.5–8.1)

## 2021-11-26 LAB — CBC WITH DIFFERENTIAL/PLATELET
Abs Immature Granulocytes: 0.08 10*3/uL — ABNORMAL HIGH (ref 0.00–0.07)
Basophils Absolute: 0.1 10*3/uL (ref 0.0–0.1)
Basophils Relative: 0 %
Eosinophils Absolute: 0.4 10*3/uL (ref 0.0–0.5)
Eosinophils Relative: 1 %
HCT: 39.4 % (ref 39.0–52.0)
Hemoglobin: 12.8 g/dL — ABNORMAL LOW (ref 13.0–17.0)
Immature Granulocytes: 0 %
Lymphocytes Relative: 75 %
Lymphs Abs: 20.6 10*3/uL — ABNORMAL HIGH (ref 0.7–4.0)
MCH: 28.1 pg (ref 26.0–34.0)
MCHC: 32.5 g/dL (ref 30.0–36.0)
MCV: 86.4 fL (ref 80.0–100.0)
Monocytes Absolute: 2.1 10*3/uL — ABNORMAL HIGH (ref 0.1–1.0)
Monocytes Relative: 8 %
Neutro Abs: 4.5 10*3/uL (ref 1.7–7.7)
Neutrophils Relative %: 16 %
Platelets: 139 10*3/uL — ABNORMAL LOW (ref 150–400)
RBC: 4.56 MIL/uL (ref 4.22–5.81)
RDW: 14.1 % (ref 11.5–15.5)
Smear Review: NORMAL
WBC: 27.8 10*3/uL — ABNORMAL HIGH (ref 4.0–10.5)
nRBC: 0 % (ref 0.0–0.2)

## 2021-11-26 LAB — PSA: Prostatic Specific Antigen: 0.06 ng/mL (ref 0.00–4.00)

## 2021-11-26 NOTE — Progress Notes (Signed)
Has noticed he's having more fatigue and weakness x2 months. Daughter states he may be dehydrated. Not drinking enough fluids, out in garden all day. Also, states pulse is lower than usual and blood pressure higher than normal.

## 2021-11-26 NOTE — Progress Notes (Signed)
[facility] NOTE  Patient Care Team: Gae Bon, NP as PCP - General (Nurse Practitioner) Cammie Sickle, MD as Consulting Physician (Oncology)  CHIEF COMPLAINTS/PURPOSE OF CONSULTATION: [name]/prostate cancer  Oncology History Overview Note  #April-MAY 2022-[name] [phenotype based on flow cytometry]; absolute neutrophil count 7000; white count 12,000 hemoglobin 13; platelets 120s. PET scan- May 2022-no evidence of any lymphadenopathy-suggestive of active [name].Marland Kitchen  Borderline enlarged spleen/normal metabolic activity; see discussion regarding left kidney [see below].   # AUG 2022-prostate adenocarcinoma Gleason score 4+5 [12 out of 12 cores]; Dr.Stoioff- SEP 12th, 2022-Eligard every 6 months  # COPD/active smoker; CAD; hx of urinary obstruction [Dr.Stoioff]; ? Left kidney UPJ obstruction ----------------------------------  HEMATOLOGY HISTORY:  # April 2022- [name] [phenotype flow cytometry]; US-splenomegaly mild.  Absolute neutrophil count 7000; hemoglobin 13; platelets 120s.   # COPD at night O2; active smoker [1-1.5ppd]; CAD/ right bundle branch block, and peripheral vascular disease [Dr.Parashoes]   [name] (chronic lymphocytic leukemia) ([place])  10/10/2020 Initial Diagnosis   [name] (chronic lymphocytic leukemia) ([place])   10/22/2020 Cancer Staging   Staging form: Chronic Lymphocytic Leukemia / Small Lymphocytic Lymphoma, AJCC 8th Edition - Clinical: Modified Rai Stage II (Modified Rai risk: Intermediate, Lymphocytosis: Present, Adenopathy: Absent, Organomegaly: Present, Anemia: Absent, Thrombocytopenia: Absent) - Signed by Cammie Sickle, MD on 10/22/2020 Stage prefix: Initial diagnosis      HISTORY OF PRESENTING ILLNESS: Elderly male patient.  Accompanied by his daughter  Spencer Buck 88 y.o.  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  Continues to complain of fatigue.  Mild hot  flashes.  No fever no chills no hospitalizations.  Since evaluation with urology-he has not been needing to do straight cath every day.  Review of Systems  Constitutional:  Positive for malaise/fatigue and weight loss. Negative for chills, diaphoresis and fever.  HENT:  Negative for nosebleeds and sore throat.   Eyes:  Negative for double vision.  Respiratory:  Negative for cough, hemoptysis, sputum production, shortness of breath and wheezing.   Cardiovascular:  Negative for chest pain, palpitations, orthopnea and leg swelling.  Gastrointestinal:  Negative for abdominal pain, blood in stool, constipation, diarrhea, heartburn, melena, nausea and vomiting.  Genitourinary:  Negative for dysuria, frequency and urgency.  Musculoskeletal:  Positive for back pain and joint pain.  Skin: Negative.  Negative for itching and rash.  Neurological:  Negative for dizziness, tingling, focal weakness, weakness and headaches.  Endo/Heme/Allergies:  Does not bruise/bleed easily.  Psychiatric/Behavioral:  Negative for depression. The patient is not nervous/anxious and does not have insomnia.     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)    Nonrheumatic aortic valve disorder    PVD (peripheral vascular disease) ([facility])    Right bundle branch block    Splenomegaly    Urinary retention     SURGICAL HISTORY: Past Surgical History:  Procedure Laterality Date   HERNIA REPAIR Left    KNEE SURGERY Left    torn ligament   PROSTATE BIOPSY N/A 01/28/2021  Procedure: PROSTATE BIOPSY;  Surgeon: Abbie Sons,  MD;  Location: [facility] ORS;  Service: Urology;  Laterality: N/A;   TRANSRECTAL ULTRASOUND N/A 01/28/2021   Procedure: TRANSRECTAL ULTRASOUND;  Surgeon: Abbie Sons, MD;  Location: [facility] ORS;  Service: Urology;  Laterality: N/A;  Spoke w/[name] in ultrasound confirmed    SOCIAL HISTORY: Social History   Socioeconomic History   Marital status: Married    Spouse name: Not on file   Number of children: Not on file   Years of education: Not on file   Highest education level: Not on file  Occupational History   Not on file  Tobacco Use   Smoking status: Every Day    Packs/day: 1.50    Years: 72.00    Total pack years: 108.00    Types: Cigarettes   Smokeless tobacco: Never  Vaping Use   Vaping Use: Never used  Substance and Sexual Activity   Alcohol use: Yes    Comment: rarely   Drug use: Never   Sexual activity: Yes  Other Topics Concern   Not on file  Social History Narrative   Not on file   Social Determinants of Health   Financial Resource Strain: Not on file  Food Insecurity: Not on file  Transportation Needs: Not on file  Physical Activity: Not on file  Stress: Not on file  Social Connections: Not on file  Intimate Partner Violence: Not on file    FAMILY HISTORY: Family History  Problem Relation Age of Onset   Stroke Paternal Grandmother    Glaucoma Other    Diabetes Other     ALLERGIES:  has No Known Allergies.  MEDICATIONS:  Current Outpatient Medications  Medication Sig Dispense Refill   albuterol (ACCUNEB) 1.25 MG/3ML nebulizer solution Take 1 ampule by nebulization every 6 (six) hours as needed for wheezing.     aspirin 81 MG EC tablet Chew 81 mg by mouth at bedtime.     Budeson-Glycopyrrol-Formoterol (BREZTRI AEROSPHERE) 160-9-4.8 MCG/ACT AERO Inhale into the lungs.     Cholecalciferol (VITAMIN D) 125 MCG (5000 UT) CAPS Take 5,000 Units by mouth daily.     [employer] CRANBERRY PO Take by mouth.     finasteride (PROSCAR) 5 MG tablet Take 5 mg by mouth daily.      MAGNESIUM GLYCINATE PO Take 500 mg by mouth daily.     Multiple Vitamin (MULTIVITAMIN) tablet Take 1 tablet by mouth daily.     sulfamethoxazole-trimethoprim (BACTRIM DS) 800-160 MG tablet Take 1 tablet by mouth every 12 (twelve) hours. 14 tablet 0   tamsulosin (FLOMAX) 0.4 MG CAPS capsule Take 0.4 mg by mouth in the morning and at bedtime.     No current facility-administered medications for this visit.      PHYSICAL EXAMINATION:   Vitals:   11/26/21 1337  BP: 105/68  Pulse: (!) 37  Temp: 97.6 F (36.4 C)  SpO2: 96%   Filed Weights   11/26/21 1337  Weight: 151 lb (68.5 kg)     Physical Exam Constitutional:      Comments: Elderly male patient ambulating independently.  Accompanied by his daughter  HENT:     Head: Normocephalic and atraumatic.     Mouth/Throat:     Pharynx: No oropharyngeal exudate.  Eyes:     Pupils: Pupils are equal, round, and reactive to light.  Cardiovascular:     Rate and Rhythm: Normal rate and regular rhythm.  Pulmonary:     Effort: No respiratory distress.  Breath sounds: No wheezing.     Comments: Decreased air entry bilaterally.   Abdominal:     General: Bowel sounds are normal. There is no distension.     Palpations: Abdomen is soft. There is no mass.     Tenderness: There is no abdominal tenderness. There is no guarding or rebound.     Comments: No obvious splenomegaly noted.  Musculoskeletal:        General: No tenderness. Normal range of motion.     Cervical back: Normal range of motion and neck supple.  Skin:    General: Skin is warm.  Neurological:     Mental Status: He is alert and oriented to person, place, and time.  Psychiatric:        Mood and Affect: Affect normal.     LABORATORY DATA:  I have reviewed the data as listed Lab Results  Component Value Date   WBC 27.8 (H) 11/26/2021   HGB 12.8 (L) 11/26/2021   HCT 39.4 11/26/2021   MCV 86.4 11/26/2021   PLT 139 (L) 11/26/2021   Recent Labs     04/21/21 0914 08/26/21 0945 11/26/21 1331  NA 135 134* 136  K 4.2 4.1 4.4  CL 98 99 100  CO2 '31 29 29  '$ GLUCOSE 94 143* 83  BUN 32* 39* 35*  CREATININE 1.30* 1.28* 1.34*  CALCIUM 9.2 9.2 9.1  GFRNONAA 53* 54* 51*  PROT 7.0 6.8 6.9  ALBUMIN 3.9 3.9 3.8  AST '21 21 19  '$ ALT '15 14 13  '$ ALKPHOS 76 70 76  BILITOT 0.6 0.6 0.6     No results found.   [name] (chronic lymphocytic leukemia) ([facility]) #[name] [phenotype based on flow cytometry]- PET scan- May 2022-no evidence of any lymphadenopathy. No obvious evidence of progressive disease from underlying [name].    white count around  27,000 normal hemoglobin 12 /platelets- 130.  STABLE.  Patient continues to be asymptomatic.  Continue surveillance  # Prostate cancer: [AUG 2022]-Gleason score:12/12 cores were positive for Gleason 4+5/5+4 adenocarcinoma. [not a candidate for definitive therapy surgery or radiation].  Baseline PSA 23-.  On  palliative option of ADT q6M.    Tolerating well except for hot flashes.  MARCH, 2023-- PSA - 0.0.9.  Awaiting PSA from today.  # Bradycardia-?  Fatigue  re-check 60/min; previous evaluation cardiology/including monitor-no arrhythmias as per family.  Hold off EKG. recommend follow-up with cardiology as on December 22, 2021  # Hot flashes-grade 1-2 from [place]  # Fatigue-grade 1-2 from Eligard/bradycardia-see above;STABLE   # CKD stage III-stable [chronic urinary obstruction-straight cathing-improved on Eligard].STABLE  # COPD-Active smoker: STABLE.   eligard q6M # DISPOSITION:  # follow up in 3 months  2022; MD; labs- cbc/cmp;PSA; [facility]-Dr.B  Pcp:       All questions were answered. The patient knows to call the clinic with any problems, questions or concerns.   Cammie Sickle, MD 11/26/2021 2:11 PM

## 2021-11-26 NOTE — Assessment & Plan Note (Addendum)
#  [name] [phenotype based on flow cytometry]- PET scan- May 2022-no evidence of any lymphadenopathy. No obvious evidence of progressive disease from underlying [name].    white count around  27,000 normal hemoglobin 12 /platelets- 130.  STABLE.  Patient continues to be asymptomatic.  Continue surveillance  # Prostate cancer: [AUG 2022]-Gleason score:12/12 cores were positive for Gleason 4+5/5+4 adenocarcinoma. [not a candidate for definitive therapy surgery or radiation].  Baseline PSA 23-.  On  palliative option of ADT q6M.    Tolerating well except for hot flashes.  MARCH, 2023-- PSA - 0.0.9.  Awaiting PSA from today.  # Bradycardia-?  Fatigue  re-check 60/min; previous evaluation cardiology/including monitor-no arrhythmias as per family.  Hold off EKG. recommend follow-up with cardiology as on December 22, 2021  # Hot flashes-grade 1-2 from [place]  # Fatigue-grade 1-2 from Eligard/bradycardia-see above;STABLE   # CKD stage III-stable [chronic urinary obstruction-straight cathing-improved on Eligard].STABLE  # COPD-Active smoker: STABLE.   eligard q6M # DISPOSITION:  # follow up in 3 months  2022; MD; labs- cbc/cmp;PSA; [facility]-Dr.B  Pcp:

## 2022-02-20 IMAGING — NM NM RENAL IMAGING FLOW W/ PHARM
3 series · 18 of 18 positions shown · non-contrast
Comparison: Renal ultrasound 11/07/2020, PET-CT 10/17/2020

CLINICAL DATA: Abnormal PET-CT and renal ultrasound demonstrating
LEFT hydronephrosis question UPJ obstruction

EXAM:
NUCLEAR MEDICINE RENAL SCAN WITH DIURETIC ADMINISTRATION
TECHNIQUE: Radionuclide angiographic and sequential renal images were obtained
after intravenous injection of radiopharmaceutical. Imaging was
continued during slow intravenous injection of Lasix approximately
15 minutes after the start of the examination.
RADIOPHARMACEUTICALS:  4.96 mCi Cechnetium-ZZm MAG3 IV
Pharmaceutical: Lasix 34 mg IV

[Series 1000: lasix renal mag 3 ( (first dynamic renal results) · 7.79mm/px · 6 of 101 frames shown]
[frame 9/101]
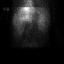
[frame 25/101]
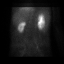
[frame 42/101]
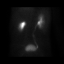
[frame 59/101]
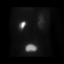
[frame 76/101]
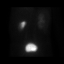
[frame 93/101]
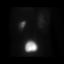

[Series 1000: lasix renal mag 3 ( (results) · 7.79mm/px · 6 of 101 frames shown]
[frame 9/101]
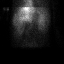
[frame 25/101]
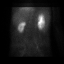
[frame 42/101]
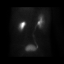
[frame 59/101]
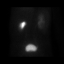
[frame 76/101]
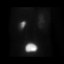
[frame 93/101]
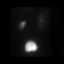

[Series 1000: lasix renal mag 3 · 7.79mm/px · 6 of 101 frames shown]
[frame 9/101]
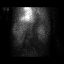
[frame 25/101]
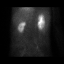
[frame 42/101]
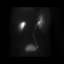
[frame 59/101]
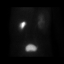
[frame 76/101]
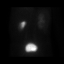
[frame 93/101]
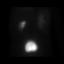

[18 of 18 positions shown; findings below may reference images not displayed]

FINDINGS: Flow: Greater blood flow to RIGHT kidney than LEFT, due to larger
RIGHT renal size.

Left renogram: Normal uptake and concentration of tracer. Excretion
of tracer into a dilated collecting system. Poor washout of tracer
before Lasix administration. Following diuretic administration,
incomplete clearance of tracer from the collecting system is seen
though there is residual tracer within the dilated collecting system
at the conclusion of the exam. Analysis of the renogram curve
demonstrates a delayed time to peak activity of 16.2 minutes with
fall to half maximum activity at 39.7 minutes.

Right renogram: Normal uptake, concentration and excretion of tracer
by RIGHT kidney. No collecting system dilatation identified. Good
clearance before and continuing following Lasix administration. No
abnormal tracer retention at conclusion of exam. Analysis of the
renogram curve demonstrates minimally delayed time to peak activity
of 8 minutes with fall to half maximum activity at approximately 17
minutes.

Differential:

Left kidney = 34 %

Right kidney = 66 %

T1/2 post Lasix :

Left kidney = 18.7 min

Right kidney = 18.5 min
IMPRESSION: Normal RIGHT renogram.

Dilated LEFT renal collecting system though there is adequate
washout of tracer from the LEFT kidney following Lasix
administration, indicating an absence of significant urinary outflow
obstruction.

Asymmetric renal function as above.

## 2022-02-26 ENCOUNTER — Encounter: Payer: Self-pay | Admitting: Internal Medicine

## 2022-02-26 ENCOUNTER — Inpatient Hospital Stay: Payer: Medicare PPO

## 2022-02-26 ENCOUNTER — Inpatient Hospital Stay: Payer: Medicare PPO | Attending: Internal Medicine | Admitting: Internal Medicine

## 2022-02-26 VITALS — BP 83/57 | HR 79 | Temp 97.5°F | Ht 71.0 in | Wt 154.6 lb

## 2022-02-26 DIAGNOSIS — J449 Chronic obstructive pulmonary disease, unspecified: Secondary | ICD-10-CM | POA: Diagnosis not present

## 2022-02-26 DIAGNOSIS — C9110 Chronic lymphocytic leukemia of B-cell type not having achieved remission: Secondary | ICD-10-CM

## 2022-02-26 DIAGNOSIS — N183 Chronic kidney disease, stage 3 unspecified: Secondary | ICD-10-CM | POA: Diagnosis not present

## 2022-02-26 DIAGNOSIS — R42 Dizziness and giddiness: Secondary | ICD-10-CM | POA: Diagnosis not present

## 2022-02-26 DIAGNOSIS — Z7982 Long term (current) use of aspirin: Secondary | ICD-10-CM | POA: Insufficient documentation

## 2022-02-26 DIAGNOSIS — F17210 Nicotine dependence, cigarettes, uncomplicated: Secondary | ICD-10-CM | POA: Diagnosis not present

## 2022-02-26 DIAGNOSIS — C61 Malignant neoplasm of prostate: Secondary | ICD-10-CM

## 2022-02-26 DIAGNOSIS — Z79899 Other long term (current) drug therapy: Secondary | ICD-10-CM | POA: Insufficient documentation

## 2022-02-26 LAB — COMPREHENSIVE METABOLIC PANEL
ALT: 14 U/L (ref 0–44)
AST: 22 U/L (ref 15–41)
Albumin: 3.9 g/dL (ref 3.5–5.0)
Alkaline Phosphatase: 79 U/L (ref 38–126)
Anion gap: 4 — ABNORMAL LOW (ref 5–15)
BUN: 37 mg/dL — ABNORMAL HIGH (ref 8–23)
CO2: 30 mmol/L (ref 22–32)
Calcium: 9.6 mg/dL (ref 8.9–10.3)
Chloride: 102 mmol/L (ref 98–111)
Creatinine, Ser: 1.24 mg/dL (ref 0.61–1.24)
GFR, Estimated: 56 mL/min — ABNORMAL LOW (ref >60)
Glucose, Bld: 96 mg/dL (ref 70–99)
Potassium: 5.2 mmol/L — ABNORMAL HIGH (ref 3.5–5.1)
Sodium: 136 mmol/L (ref 135–145)
Total Bilirubin: 0.5 mg/dL (ref 0.3–1.2)
Total Protein: 7 g/dL (ref 6.5–8.1)

## 2022-02-26 LAB — CBC WITH DIFFERENTIAL/PLATELET
Abs Immature Granulocytes: 0.05 10*3/uL (ref 0.00–0.07)
Basophils Absolute: 0.1 10*3/uL (ref 0.0–0.1)
Basophils Relative: 0 %
Eosinophils Absolute: 0.5 10*3/uL (ref 0.0–0.5)
Eosinophils Relative: 2 %
HCT: 39.7 % (ref 39.0–52.0)
Hemoglobin: 12.7 g/dL — ABNORMAL LOW (ref 13.0–17.0)
Immature Granulocytes: 0 %
Lymphocytes Relative: 72 %
Lymphs Abs: 14.8 10*3/uL — ABNORMAL HIGH (ref 0.7–4.0)
MCH: 27.6 pg (ref 26.0–34.0)
MCHC: 32 g/dL (ref 30.0–36.0)
MCV: 86.3 fL (ref 80.0–100.0)
Monocytes Absolute: 1.6 10*3/uL — ABNORMAL HIGH (ref 0.1–1.0)
Monocytes Relative: 8 %
Neutro Abs: 3.7 10*3/uL (ref 1.7–7.7)
Neutrophils Relative %: 18 %
Platelets: 100 10*3/uL — ABNORMAL LOW (ref 150–400)
RBC: 4.6 MIL/uL (ref 4.22–5.81)
RDW: 14.6 % (ref 11.5–15.5)
Smear Review: NORMAL
WBC: 20.6 10*3/uL — ABNORMAL HIGH (ref 4.0–10.5)
nRBC: 0 % (ref 0.0–0.2)

## 2022-02-26 LAB — PSA: Prostatic Specific Antigen: 0.03 ng/mL (ref 0.00–4.00)

## 2022-02-26 MED ORDER — LEUPROLIDE ACETATE (6 MONTH) 45 MG ~~LOC~~ KIT
45.0000 mg | PACK | Freq: Once | SUBCUTANEOUS | Status: AC
  Administered 2022-02-26: 45 mg via SUBCUTANEOUS
  Filled 2022-02-26: qty 45

## 2022-02-26 NOTE — Progress Notes (Signed)
[place] NOTE  Patient Care Team: Gae Bon, NP as PCP - General (Nurse Practitioner) Cammie Sickle, MD as Consulting Physician (Oncology)  CHIEF COMPLAINTS/PURPOSE OF CONSULTATION: [name]/prostate cancer  Oncology History Overview Note  #April-MAY 2022-[name] [phenotype based on flow cytometry]; absolute neutrophil count 7000; white count 12,000 hemoglobin 13; platelets 120s. PET scan- May 2022-no evidence of any lymphadenopathy-suggestive of active [name].Marland Kitchen  Borderline enlarged spleen/normal metabolic activity; see discussion regarding left kidney [see below].   # AUG 2022-prostate adenocarcinoma Gleason score 4+5 [12 out of 12 cores]; Dr.Stoioff- SEP 12th, 2022-Eligard every 6 months  # COPD/active smoker; CAD; hx of urinary obstruction [Dr.Stoioff]; ? Left kidney UPJ obstruction ----------------------------------  HEMATOLOGY HISTORY:  # April 2022- [name] [phenotype flow cytometry]; US-splenomegaly mild.  Absolute neutrophil count 7000; hemoglobin 13; platelets 120s.   # COPD at night O2; active smoker [1-1.5ppd]; CAD/ right bundle branch block, and peripheral vascular disease [Dr.Parashoes]   [name] (chronic lymphocytic leukemia) ([place])  10/10/2020 Initial Diagnosis   [name] (chronic lymphocytic leukemia) ([place])   10/22/2020 Cancer Staging   Staging form: Chronic Lymphocytic Leukemia / Small Lymphocytic Lymphoma, AJCC 8th Edition - Clinical: Modified Rai Stage II (Modified Rai risk: Intermediate, Lymphocytosis: Present, Adenopathy: Absent, Organomegaly: Present, Anemia: Absent, Thrombocytopenia: Absent) - Signed by Cammie Sickle, MD on 10/22/2020 Stage prefix: Initial diagnosis      HISTORY OF PRESENTING ILLNESS: Elderly male patient.  Accompanied by his daughter  Prescott Truex 88 y.o.  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  Complains of intermittent dizziness.  Blood  pressure runs borderline low in the 80s to 90s at home.  Improved with hydration.   Continues to complain of fatigue.  Mild hot flashes.  No fever no chills no hospitalizations.    Review of Systems  Constitutional:  Positive for malaise/fatigue and weight loss. Negative for chills, diaphoresis and fever.  HENT:  Negative for nosebleeds and sore throat.   Eyes:  Negative for double vision.  Respiratory:  Negative for cough, hemoptysis, sputum production, shortness of breath and wheezing.   Cardiovascular:  Negative for chest pain, palpitations, orthopnea and leg swelling.  Gastrointestinal:  Negative for abdominal pain, blood in stool, constipation, diarrhea, heartburn, melena, nausea and vomiting.  Genitourinary:  Negative for dysuria, frequency and urgency.  Musculoskeletal:  Positive for back pain and joint pain.  Skin: Negative.  Negative for itching and rash.  Neurological:  Negative for dizziness, tingling, focal weakness, weakness and headaches.  Endo/Heme/Allergies:  Does not bruise/bleed easily.  Psychiatric/Behavioral:  Negative for depression. The patient is not nervous/anxious and does not have insomnia.     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)    Nonrheumatic aortic valve disorder    PVD (peripheral vascular disease) (HCC)    Right bundle branch block    Splenomegaly    Urinary retention     SURGICAL HISTORY: Past Surgical History:  Procedure Laterality Date   HERNIA REPAIR Left    KNEE SURGERY Left  torn ligament   PROSTATE BIOPSY N/A 01/28/2021    Procedure: PROSTATE BIOPSY;  Surgeon: Abbie Sons, MD;  Location: [facility] ORS;  Service: Urology;  Laterality: N/A;   TRANSRECTAL ULTRASOUND N/A 01/28/2021   Procedure: TRANSRECTAL ULTRASOUND;  Surgeon: Abbie Sons, MD;  Location: [facility] ORS;  Service: Urology;  Laterality: N/A;  Spoke w/[name] in ultrasound confirmed    SOCIAL HISTORY: Social History   Socioeconomic History   Marital status: Married    Spouse name: Not on file   Number of children: Not on file   Years of education: Not on file   Highest education level: Not on file  Occupational History   Not on file  Tobacco Use   Smoking status: Every Day    Packs/day: 1.50    Years: 72.00    Total pack years: 108.00    Types: Cigarettes   Smokeless tobacco: Never  Vaping Use   Vaping Use: Never used  Substance and Sexual Activity   Alcohol use: Yes    Comment: rarely   Drug use: Never   Sexual activity: Yes  Other Topics Concern   Not on file  Social History Narrative   Not on file   Social Determinants of Health   Financial Resource Strain: Not on file  Food Insecurity: Not on file  Transportation Needs: Not on file  Physical Activity: Not on file  Stress: Not on file  Social Connections: Not on file  Intimate Partner Violence: Not on file    FAMILY HISTORY: Family History  Problem Relation Age of Onset   Stroke Paternal Grandmother    Glaucoma Other    Diabetes Other     ALLERGIES:  has No Known Allergies.  MEDICATIONS:  Current Outpatient Medications  Medication Sig Dispense Refill   albuterol (ACCUNEB) 1.25 MG/3ML nebulizer solution Take 1 ampule by nebulization every 6 (six) hours as needed for wheezing.     aspirin 81 MG EC tablet Chew 81 mg by mouth at bedtime.     Budeson-Glycopyrrol-Formoterol (BREZTRI AEROSPHERE) 160-9-4.8 MCG/ACT AERO Inhale into the lungs.     Cholecalciferol (VITAMIN D) 125 MCG (5000 UT) CAPS Take 5,000 Units by mouth daily.     [employer] CRANBERRY PO Take by mouth.      finasteride (PROSCAR) 5 MG tablet Take 5 mg by mouth daily.     MAGNESIUM GLYCINATE PO Take 500 mg by mouth daily.     Multiple Vitamin (MULTIVITAMIN) tablet Take 1 tablet by mouth daily.     tamsulosin (FLOMAX) 0.4 MG CAPS capsule Take 0.4 mg by mouth in the morning and at bedtime.     No current facility-administered medications for this visit.   Facility-Administered Medications Ordered in Other Visits  Medication Dose Route Frequency Provider Last Rate Last Admin   leuprolide (6 Month) (ELIGARD) injection 45 mg  45 mg Subcutaneous Once Cammie Sickle, MD          PHYSICAL EXAMINATION:   Vitals:   02/26/22 1121  BP: (!) 83/57  Pulse: 79  Temp: (!) 97.5 F (36.4 C)  SpO2: 98%   Filed Weights   02/26/22 1121  Weight: 154 lb 9.6 oz (70.1 kg)     Physical Exam Constitutional:      Comments: Elderly male patient ambulating independently.  Accompanied by his daughter  HENT:     Head: Normocephalic and atraumatic.     Mouth/Throat:     Pharynx: No oropharyngeal exudate.  Eyes:     Pupils:  Pupils are equal, round, and reactive to light.  Cardiovascular:     Rate and Rhythm: Normal rate and regular rhythm.  Pulmonary:     Effort: No respiratory distress.     Breath sounds: No wheezing.     Comments: Decreased air entry bilaterally.   Abdominal:     General: Bowel sounds are normal. There is no distension.     Palpations: Abdomen is soft. There is no mass.     Tenderness: There is no abdominal tenderness. There is no guarding or rebound.     Comments: No obvious splenomegaly noted.  Musculoskeletal:        General: No tenderness. Normal range of motion.     Cervical back: Normal range of motion and neck supple.  Skin:    General: Skin is warm.  Neurological:     Mental Status: He is alert and oriented to person, place, and time.  Psychiatric:        Mood and Affect: Affect normal.     LABORATORY DATA:  I have reviewed the data as listed Lab Results   Component Value Date   WBC 20.6 (H) 02/26/2022   HGB 12.7 (L) 02/26/2022   HCT 39.7 02/26/2022   MCV 86.3 02/26/2022   PLT 100 (L) 02/26/2022   Recent Labs    08/26/21 0945 11/26/21 1331 02/26/22 1112  NA 134* 136 136  K 4.1 4.4 5.2*  CL 99 100 102  CO2 '29 29 30  '$ GLUCOSE 143* 83 96  BUN 39* 35* 37*  CREATININE 1.28* 1.34* 1.24  CALCIUM 9.2 9.1 9.6  GFRNONAA 54* 51* 56*  PROT 6.8 6.9 7.0  ALBUMIN 3.9 3.8 3.9  AST '21 19 22  '$ ALT '14 13 14  '$ ALKPHOS 70 76 79  BILITOT 0.6 0.6 0.5     No results found.   [name] (chronic lymphocytic leukemia) (HCC) #[name] [phenotype based on flow cytometry]- PET scan- May 2022-no evidence of any lymphadenopathy. No obvious evidence of progressive disease from underlying [name].    white count around  20,000 normal hemoglobin 12 /platelets- >100.  STABLE.  Patient continues to be asymptomatic.  Continue surveillance  # Prostate cancer: [AUG 2022]-Gleason score:12/12 cores were positive for Gleason 4+5/5+4 adenocarcinoma. [not a candidate for definitive therapy surgery or radiation].  Baseline PSA 23-.  On  palliative option of ADT q6M.    Tolerating well except for hot flashes.  JUNE, 2023-- PSA - 0.09  Awaiting PSA from today.  # Hypotension:?  Intermittent mild dizziness.  Secondary to Flomax [BID]; recommend increasing fluids.  Does not appear cardiac  # Hot flashes-grade 1-2 from Eligard- STABLE  # CKD stage III-stable [chronic urinary obstruction-straight cathing-improved on Eligard].STABLE  # COPD-Active smoker: .STABLE  eligard q6M  # DISPOSITION:  # Eliagrd today # follow up in 6 months MD; labs- cbc/cmp;PSA; ELIGARD-Dr.B  Pcp:       All questions were answered. The patient knows to call the clinic with any problems, questions or concerns.   Cammie Sickle, MD 02/26/2022 12:24 PM

## 2022-02-26 NOTE — Assessment & Plan Note (Addendum)
#  [name] [phenotype based on flow cytometry]- PET scan- May 2022-no evidence of any lymphadenopathy. No obvious evidence of progressive disease from underlying [name].    white count around  20,000 normal hemoglobin 12 /platelets- >100.  STABLE.  Patient continues to be asymptomatic.  Continue surveillance  # Prostate cancer: [AUG 2022]-Gleason score:12/12 cores were positive for Gleason 4+5/5+4 adenocarcinoma. [not a candidate for definitive therapy surgery or radiation].  Baseline PSA 23-.  On  palliative option of ADT q6M.    Tolerating well except for hot flashes.  JUNE, 2023-- PSA - 0.09  Awaiting PSA from today.  # Hypotension:?  Intermittent mild dizziness.  Secondary to Flomax [BID]; recommend increasing fluids.  Does not appear cardiac  # Hot flashes-grade 1-2 from Eligard- STABLE  # CKD stage III-stable [chronic urinary obstruction-? straight cathing-improved on Eligard].STABLE  # COPD-Active smoker: .STABLE  eligard q6M  # DISPOSITION:  # Eliagrd today # follow up in 6 months MD; labs- cbc/cmp;PSA; ELIGARD-Dr.B  Pcp:

## 2022-02-26 NOTE — Progress Notes (Signed)
Daughter states BP was 80/39 last week. Gave lots of fluids and came back up. Had fatigue, dizziness, feels better today.  More SOB, using albuterol more, O2 qhs.  C/o weakness and lack of energy.

## 2022-04-21 ENCOUNTER — Ambulatory Visit (INDEPENDENT_AMBULATORY_CARE_PROVIDER_SITE_OTHER): Payer: Medicare PPO

## 2022-04-21 ENCOUNTER — Ambulatory Visit
Admission: EM | Admit: 2022-04-21 | Discharge: 2022-04-21 | Disposition: A | Discharge status: Home / Self Care | Payer: Medicare PPO | Attending: Physician Assistant | Admitting: Physician Assistant

## 2022-04-21 DIAGNOSIS — Z1152 Encounter for screening for COVID-19: Secondary | ICD-10-CM | POA: Insufficient documentation

## 2022-04-21 DIAGNOSIS — R053 Chronic cough: Secondary | ICD-10-CM | POA: Insufficient documentation

## 2022-04-21 DIAGNOSIS — J22 Unspecified acute lower respiratory infection: Secondary | ICD-10-CM | POA: Insufficient documentation

## 2022-04-21 DIAGNOSIS — R5383 Other fatigue: Secondary | ICD-10-CM | POA: Diagnosis not present

## 2022-04-21 DIAGNOSIS — J441 Chronic obstructive pulmonary disease with (acute) exacerbation: Secondary | ICD-10-CM | POA: Insufficient documentation

## 2022-04-21 DIAGNOSIS — R0602 Shortness of breath: Secondary | ICD-10-CM | POA: Diagnosis not present

## 2022-04-21 DIAGNOSIS — N3001 Acute cystitis with hematuria: Secondary | ICD-10-CM | POA: Insufficient documentation

## 2022-04-21 DIAGNOSIS — Z8546 Personal history of malignant neoplasm of prostate: Secondary | ICD-10-CM | POA: Diagnosis not present

## 2022-04-21 DIAGNOSIS — I2510 Atherosclerotic heart disease of native coronary artery without angina pectoris: Secondary | ICD-10-CM | POA: Insufficient documentation

## 2022-04-21 DIAGNOSIS — C9110 Chronic lymphocytic leukemia of B-cell type not having achieved remission: Secondary | ICD-10-CM | POA: Diagnosis not present

## 2022-04-21 DIAGNOSIS — Z720 Tobacco use: Secondary | ICD-10-CM | POA: Insufficient documentation

## 2022-04-21 DIAGNOSIS — I739 Peripheral vascular disease, unspecified: Secondary | ICD-10-CM | POA: Insufficient documentation

## 2022-04-21 LAB — URINALYSIS, MICROSCOPIC (REFLEX): WBC, UA: >50 WBC/hpf (ref 0–5)

## 2022-04-21 LAB — URINALYSIS, ROUTINE W REFLEX MICROSCOPIC
Bilirubin Urine: NEGATIVE
Glucose, UA: NEGATIVE mg/dL
Ketones, ur: NEGATIVE mg/dL
Nitrite: NEGATIVE
Protein, ur: >300 mg/dL — AB
Specific Gravity, Urine: 1.02 (ref 1.005–1.030)
pH: 6.5 (ref 5.0–8.0)

## 2022-04-21 LAB — RESP PANEL BY RT-PCR (FLU A&B, COVID) ARPGX2
Influenza A by PCR: NEGATIVE
Influenza B by PCR: NEGATIVE
SARS Coronavirus 2 by RT PCR: NEGATIVE

## 2022-04-21 MED ORDER — DOXYCYCLINE HYCLATE 100 MG PO CAPS: 100.0000 mg | ORAL_CAPSULE | Freq: Two times a day (BID) | ORAL | 0 refills | Status: AC

## 2022-04-21 MED ORDER — CEFDINIR 300 MG PO CAPS: 300.0000 mg | ORAL_CAPSULE | Freq: Two times a day (BID) | ORAL | 0 refills | Status: AC

## 2022-04-21 MED ORDER — PREDNISONE 20 MG PO TABS: 40.0000 mg | ORAL_TABLET | Freq: Every day | ORAL | 0 refills | Status: AC

## 2022-04-21 NOTE — ED Provider Notes (Signed)
[facility]    CSN: [id number] Arrival date & time: 04/21/22  1221      History   Chief Complaint Chief Complaint  Patient presents with   Fatigue    HPI Spencer Buck is a 88 y.o. male with extensive past medical history significant for coronary artery disease with cardiomegaly and heart failure, aortic valve disorder, aortic atherosclerosis, anemia, peripheral vascular disease, prostate cancer, chronic lymphocytic leukemia, COPD, and tobacco abuse.  Patient presents with his daughter today for feeling fatigued and weak.  He has also had a little bit more shortness of breath than usual and cough seems to be a little worse than normal he does have a chronic cough.  Patient's daughter says his oxygen was ranging in the low 90s recently.  He is currently at 99% oxygen without any supplemental oxygen.  She says his blood pressure is always low and since he is not symptomatic with it, cardiology is just following it.  Blood pressure today is 83/71.  She says this is normal for him.  Review of previous charts shows this is true.  He has had a little bit of nasal congestion as well.  They report urinary frequency but this is not abnormal for him.  He has not had any painful urination or flank pain.  He denies any headaches, dizziness, palpitations, numbness/tingling, chest pain, abdominal pain, sore throat, nausea/vomiting or other symptoms.  He has had 2 episodes of diarrhea today.  He has not taken any medication for symptoms.  He has not been around any sick contacts.  They have no other complaints today.    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)    Nonrheumatic aortic valve disorder    PVD (peripheral vascular disease) (HCC)    Right bundle branch block    Splenomegaly    Urinary retention     Patient Active Problem List   Diagnosis Date Noted   Prostate cancer ([place]) 02/03/2021   Pulmonary emphysema ([place]) 01/09/2021   [name] (chronic lymphocytic leukemia) ([place]) 10/10/2020   Lymphocytosis 10/03/2020   Dyspnea on exertion 10/03/2020   Splenomegaly 10/03/2020   Chronic venous insufficiency 08/25/2020   Lymphedema 08/25/2020   Abnormal ECG 08/05/2020   Left ventricular diastolic dysfunction 12/87/8676   Pedal edema 08/05/2020   Peripheral vascular disease ([place]) 08/05/2020   Premature ventricular contractions 08/05/2020   RBBB (right bundle branch block) 08/05/2020   Tobacco abuse 08/05/2020    Past Surgical History:  Procedure Laterality Date   HERNIA REPAIR Left    KNEE SURGERY Left    torn ligament   PROSTATE BIOPSY N/A 01/28/2021   Procedure: PROSTATE BIOPSY;  Surgeon: Abbie Sons, MD;  Location: [facility] ORS;  Service: Urology;  Laterality: N/A;   TRANSRECTAL ULTRASOUND N/A 01/28/2021   Procedure: TRANSRECTAL ULTRASOUND;  Surgeon: Abbie Sons, MD;  Location: [facility] ORS;  Service: Urology;  Laterality: N/A;  Spoke w/[name] in ultrasound confirmed       Home Medications    Prior to Admission medications   Medication Sig Start Date  End Date Taking? Authorizing Provider  cefdinir (OMNICEF) 300 MG capsule Take 1 capsule (300 mg total) by mouth 2 (two) times daily for 7 days. 04/21/22 04/28/22 Yes Danton Clap, PA-C  doxycycline (VIBRAMYCIN) 100 MG capsule Take 1 capsule (100 mg total) by mouth 2 (two) times daily for 7 days. 04/21/22 04/28/22 Yes Danton Clap, PA-C  predniSONE (DELTASONE) 20 MG tablet Take 2 tablets (40 mg total) by mouth daily for 5 days. 04/21/22 04/26/22 Yes Laurene Footman B, PA-C  albuterol (ACCUNEB) 1.25 MG/3ML nebulizer  solution Take 1 ampule by nebulization every 6 (six) hours as needed for wheezing.    [provider]  aspirin 81 MG EC tablet Chew 81 mg by mouth at bedtime.    [provider]  Budeson-Glycopyrrol-Formoterol (BREZTRI AEROSPHERE) 160-9-4.8 MCG/ACT AERO Inhale into the lungs.    [provider]  Cholecalciferol (VITAMIN D) 125 MCG (5000 UT) CAPS Take 5,000 Units by mouth daily.    [provider]  [employer] CRANBERRY PO Take by mouth.    [provider]  finasteride (PROSCAR) 5 MG tablet Take 5 mg by mouth daily. 03/05/20   [provider]  MAGNESIUM GLYCINATE PO Take 500 mg by mouth daily.    [provider]  Multiple Vitamin (MULTIVITAMIN) tablet Take 1 tablet by mouth daily.    [provider]  tamsulosin (FLOMAX) 0.4 MG CAPS capsule Take 0.4 mg by mouth in the morning and at bedtime. 03/05/20   [provider]    Family History Family History  Problem Relation Age of Onset   Stroke Paternal Grandmother    Glaucoma Other    Diabetes Other     Social History Social History   Tobacco Use   Smoking status: Every Day    Packs/day: 1.50    Years: 72.00    Total pack years: 108.00    Types: Cigarettes   Smokeless tobacco: Never  Vaping Use   Vaping Use: Never used  Substance Use Topics   Alcohol use: Yes    Comment: rarely   Drug use: Never     Allergies   Patient has no known allergies.   Review of Systems Review of Systems  Constitutional:  Positive for fatigue. Negative for fever.  HENT:  Positive for congestion and rhinorrhea. Negative for sinus pressure, sinus pain and sore throat.   Respiratory:  Positive for cough, shortness of breath and wheezing.   Cardiovascular:  Negative for chest pain and palpitations.  Gastrointestinal:  Positive for diarrhea. Negative for abdominal pain, nausea and vomiting.  Genitourinary:  Positive for frequency. Negative for difficulty urinating and dysuria.   Musculoskeletal:  Negative for myalgias.  Neurological:  Positive for weakness. Negative for dizziness, syncope, light-headedness and headaches.  Hematological:  Negative for adenopathy.     Physical Exam Triage Vital Signs ED Triage Vitals  Enc Vitals Group     BP      Pulse      Resp      Temp      Temp src      SpO2      Weight      Height      Head Circumference      Peak Flow      Pain Score      Pain Loc      Pain Edu?      Excl. in [place]?    No data found.  Updated Vital Signs BP (S) (!) 83/71 (BP  Location: Left Arm) Comment: baseline  Pulse 80   Temp 98 F (36.7 C) (Axillary)   Resp (!) 28   SpO2 99%      Physical Exam Vitals and nursing note reviewed.  Constitutional:      General: He is not in acute distress.    Appearance: Normal appearance. He is well-developed. He is ill-appearing.  HENT:     Head: Normocephalic and atraumatic.     Nose: Congestion and rhinorrhea present.     Mouth/Throat:     Mouth: Mucous membranes are moist.     Pharynx: Oropharynx is clear.  Eyes:     General: No scleral icterus.    Conjunctiva/sclera: Conjunctivae normal.  Cardiovascular:     Rate and Rhythm: Normal rate and regular rhythm.  Pulmonary:     Effort: Pulmonary effort is normal. No respiratory distress.     Breath sounds: Wheezing (scattered wheezing of bilateral lung bases) present.  Abdominal:     Palpations: Abdomen is soft.     Tenderness: There is no abdominal tenderness. There is no right CVA tenderness or left CVA tenderness.  Musculoskeletal:     Cervical back: Neck supple.     Right lower leg: No edema.     Left lower leg: No edema.  Skin:    General: Skin is warm and dry.     Capillary Refill: Capillary refill takes less than 2 seconds.  Neurological:     General: No focal deficit present.     Mental Status: He is alert and oriented to person, place, and time. Mental status is at baseline.     Motor: No weakness.     Coordination:  Coordination normal.     Gait: Gait normal.  Psychiatric:        Mood and Affect: Mood normal.        Behavior: Behavior normal.      UC Treatments / Results  Labs (all labs ordered are listed, but only abnormal results are displayed) Labs Reviewed  URINALYSIS, ROUTINE W REFLEX MICROSCOPIC - Abnormal; Notable for the following components:      Result Value   APPearance CLOUDY (*)    Hgb urine dipstick MODERATE (*)    Protein, ur >300 (*)    Leukocytes,Ua LARGE (*)    All other components within normal limits  URINALYSIS, MICROSCOPIC (REFLEX) - Abnormal; Notable for the following components:   Bacteria, UA MANY (*)    All other components within normal limits  RESP PANEL BY RT-PCR (FLU A&B, COVID) ARPGX2  URINE CULTURE    EKG   Radiology DG Chest 2 View  Result Date: 04/21/2022 CLINICAL DATA:  Short of breath and weakness.  COPD EXAM: CHEST - 2 VIEW COMPARISON:  Chest 10/04/2020 FINDINGS: COPD with pulmonary hyperinflation. Increased reticular nodule lung markings lung bases compared to the prior study. Upper lobes clear. No effusion. Negative for heart failure or edema IMPRESSION: 1. COPD. 2. Increased reticular nodule lung markings in the lung bases compared with the prior study. This could be due to infection. Electronically Signed   By: Franchot Gallo M.D.   On: 04/21/2022 14:05    Procedures ED EKG  Date/Time: 04/21/2022 1:26 PM  Performed by: Danton Clap, PA-C Authorized by: Danton Clap, PA-C   Previous ECG:    Previous ECG:  Compared to current   Similarity:  Changes noted   Comparison ECG info:  PVCs Interpretation:    Interpretation: abnormal   Rate:  ECG rate:  83   ECG rate assessment: normal   Rhythm:    Rhythm: sinus rhythm   Ectopy:    Ectopy: PVCs   QRS:    QRS axis:  Normal   QRS intervals:  Normal   QRS conduction: RBBB   ST segments:    ST segments:  Normal T waves:    T waves: normal   Comments:     Normal sinus rhythm with  occasional PVCs, RBBB  (including critical care time)  Medications Ordered in UC Medications - No data to display  Initial Impression / Assessment and Plan / UC Course  I have reviewed the triage vital signs and the nursing notes.  Pertinent labs & imaging results that were available during my care of the patient were reviewed by me and considered in my medical decision making (see chart for details).   88 year old male with history of prostate cancer, [name], coronary artery disease, CHF, COPD presents for feeling increased fatigue, weakness, cough, nasal congestion, shortness of breath, diarrhea x2 episodes, urinary frequency.  Onset of symptoms was today.  No sick contacts.  No fevers.  BP is decreased to 83/71.  Review of previous medical records shows this is typical for him.  Respiratory rate is increased in triage but not when I evaluate him.  He is not in any respiratory distress and is lying on the exam bed comfortably.  He does have diffuse wheezing of bilateral lung bases, nasal congestion with clear drainage.  He is ill-appearing but nontoxic.  Appears to be tired.  No abdominal tenderness and no CVA tenderness.  EKG today shows sinus rhythm with right bundle branch block and PVCs.  This compared EKG from 2021 shows the only changes PVCs.  Urinalysis today shows cloudy urine with moderate hemoglobin and large leukocytes with white blood cell clumps and many bacteria.  We will send for culture but appears to be consistent with a urinary tract infection.  Respiratory panel obtained is negative for flu and COVID.  Chest x-ray performed today does not show any definite pneumonia, but there is reticular lung markings in the bases which could indicate infection per radiologist.  Suspect urinary tract infection and COPD exacerbation with possible early pneumonia based on his findings today.  We will cover him with antibiotics doxycycline and cefdinir.  Also sent prednisone to pharmacy and  encouraged use of his inhalers, plenty rest and fluids, Mucinex.  Advised to make a follow-up appointment with PCP.  Advised that we may contact them and alter the antibiotic based on the culture result.  Advise going to emergency department if he has any fever or increased weakness or if he is not feeling better in a couple of days.   Final Clinical Impressions(s) / UC Diagnoses   Final diagnoses:  Other fatigue  Acute cystitis with hematuria  Lower respiratory infection  COPD exacerbation ([place])     Discharge Instructions      UTI: Based on either symptoms or urinalysis, you may have a urinary tract infection. We will send the urine for culture and call with results in a few days. Begin antibiotics at this time. Your symptoms should be much improved over the next 2-3 days. Increase rest and fluid intake. If for some reason symptoms are worsening or not improving after a couple of days or the urine culture determines the antibiotics you are taking will not treat the infection, the antibiotics may be changed. Return or go to ER for fever, back  pain, worsening urinary pain, discharge, increased blood in urine. May take Tylenol or Motrin OTC for pain relief or consider AZO if no contraindications   COUGH: Negative flu and COVID.  X-ray performed today shows hospital lower respiratory infection so I have sent antibiotics to cover for that and prednisone to help with the COPD flareup.  Use nebulizer and inhalers at home shortness of breath.  Contact PCP to make a follow-up. Go to emergency department for any acute worsening of symptoms.      ED Prescriptions     Medication Sig Dispense Auth. Provider   doxycycline (VIBRAMYCIN) 100 MG capsule Take 1 capsule (100 mg total) by mouth 2 (two) times daily for 7 days. 14 capsule Laurene Footman B, PA-C   predniSONE (DELTASONE) 20 MG tablet Take 2 tablets (40 mg total) by mouth daily for 5 days. 10 tablet Laurene Footman B, PA-C   cefdinir (OMNICEF)  300 MG capsule Take 1 capsule (300 mg total) by mouth 2 (two) times daily for 7 days. 14 capsule Danton Clap, PA-C      PDMP not reviewed this encounter.   Danton Clap, PA-C 04/21/22 1443

## 2022-04-21 NOTE — Discharge Instructions (Signed)
UTI: Based on either symptoms or urinalysis, you may have a urinary tract infection. We will send the urine for culture and call with results in a few days. Begin antibiotics at this time. Your symptoms should be much improved over the next 2-3 days. Increase rest and fluid intake. If for some reason symptoms are worsening or not improving after a couple of days or the urine culture determines the antibiotics you are taking will not treat the infection, the antibiotics may be changed. Return or go to ER for fever, back pain, worsening urinary pain, discharge, increased blood in urine. May take Tylenol or Motrin OTC for pain relief or consider AZO if no contraindications   COUGH: Negative flu and COVID.  X-ray performed today shows hospital lower respiratory infection so I have sent antibiotics to cover for that and prednisone to help with the COPD flareup.  Use nebulizer and inhalers at home shortness of breath.  Contact PCP to make a follow-up. Go to emergency department for any acute worsening of symptoms.

## 2022-04-21 NOTE — ED Triage Notes (Signed)
Patient presents to UC for fatigue, SOB, body aches since yesterday. Diarrhea since today.  Daughter states low BP in the 80/40, 02 92-93%. She states she put him on oxygen and it went up to 96%. Hx of dehydration and baseline BP in 80's, uses oxygen at bedtime, daughter concerned with weakness.   Denies chest pain or fever.

## 2022-04-23 LAB — URINE CULTURE: Culture: 50000 — AB

## 2022-08-27 ENCOUNTER — Inpatient Hospital Stay: Payer: Medicare PPO | Attending: Internal Medicine

## 2022-08-27 ENCOUNTER — Inpatient Hospital Stay: Payer: Medicare PPO

## 2022-08-27 ENCOUNTER — Other Ambulatory Visit: Payer: Self-pay

## 2022-08-27 ENCOUNTER — Inpatient Hospital Stay (HOSPITAL_BASED_OUTPATIENT_CLINIC_OR_DEPARTMENT_OTHER): Payer: Medicare PPO | Admitting: Internal Medicine

## 2022-08-27 ENCOUNTER — Encounter: Payer: Self-pay | Admitting: Internal Medicine

## 2022-08-27 VITALS — BP 91/58 | HR 90 | Temp 96.8°F | Resp 18 | Wt 158.0 lb

## 2022-08-27 DIAGNOSIS — J449 Chronic obstructive pulmonary disease, unspecified: Secondary | ICD-10-CM | POA: Insufficient documentation

## 2022-08-27 DIAGNOSIS — Z79899 Other long term (current) drug therapy: Secondary | ICD-10-CM | POA: Insufficient documentation

## 2022-08-27 DIAGNOSIS — D649 Anemia, unspecified: Secondary | ICD-10-CM | POA: Diagnosis not present

## 2022-08-27 DIAGNOSIS — N182 Chronic kidney disease, stage 2 (mild): Secondary | ICD-10-CM | POA: Diagnosis not present

## 2022-08-27 DIAGNOSIS — C9110 Chronic lymphocytic leukemia of B-cell type not having achieved remission: Secondary | ICD-10-CM | POA: Insufficient documentation

## 2022-08-27 DIAGNOSIS — Z79818 Long term (current) use of other agents affecting estrogen receptors and estrogen levels: Secondary | ICD-10-CM | POA: Insufficient documentation

## 2022-08-27 DIAGNOSIS — C61 Malignant neoplasm of prostate: Secondary | ICD-10-CM

## 2022-08-27 DIAGNOSIS — F17210 Nicotine dependence, cigarettes, uncomplicated: Secondary | ICD-10-CM | POA: Diagnosis not present

## 2022-08-27 DIAGNOSIS — Z7982 Long term (current) use of aspirin: Secondary | ICD-10-CM | POA: Insufficient documentation

## 2022-08-27 LAB — CBC WITH DIFFERENTIAL/PLATELET
Abs Immature Granulocytes: 0.04 10*3/uL (ref 0.00–0.07)
Basophils Absolute: 0 10*3/uL (ref 0.0–0.1)
Basophils Relative: 0 %
Eosinophils Absolute: 0.2 10*3/uL (ref 0.0–0.5)
Eosinophils Relative: 2 %
HCT: 36.1 % — ABNORMAL LOW (ref 39.0–52.0)
Hemoglobin: 11.5 g/dL — ABNORMAL LOW (ref 13.0–17.0)
Immature Granulocytes: 0 %
Lymphocytes Relative: 55 %
Lymphs Abs: 5.6 10*3/uL — ABNORMAL HIGH (ref 0.7–4.0)
MCH: 27.8 pg (ref 26.0–34.0)
MCHC: 31.9 g/dL (ref 30.0–36.0)
MCV: 87.2 fL (ref 80.0–100.0)
Monocytes Absolute: 1 10*3/uL (ref 0.1–1.0)
Monocytes Relative: 9 %
Neutro Abs: 3.5 10*3/uL (ref 1.7–7.7)
Neutrophils Relative %: 34 %
Platelets: 95 10*3/uL — ABNORMAL LOW (ref 150–400)
RBC: 4.14 MIL/uL — ABNORMAL LOW (ref 4.22–5.81)
RDW: 15.3 % (ref 11.5–15.5)
Smear Review: NORMAL
WBC: 10.3 10*3/uL (ref 4.0–10.5)
nRBC: 0 % (ref 0.0–0.2)

## 2022-08-27 LAB — COMPREHENSIVE METABOLIC PANEL
ALT: 12 U/L (ref 0–44)
AST: 19 U/L (ref 15–41)
Albumin: 3.5 g/dL (ref 3.5–5.0)
Alkaline Phosphatase: 66 U/L (ref 38–126)
Anion gap: 6 (ref 5–15)
BUN: 36 mg/dL — ABNORMAL HIGH (ref 8–23)
CO2: 25 mmol/L (ref 22–32)
Calcium: 8.8 mg/dL — ABNORMAL LOW (ref 8.9–10.3)
Chloride: 102 mmol/L (ref 98–111)
Creatinine, Ser: 1.13 mg/dL (ref 0.61–1.24)
GFR, Estimated: >60 mL/min (ref >60)
Glucose, Bld: 96 mg/dL (ref 70–99)
Potassium: 4.2 mmol/L (ref 3.5–5.1)
Sodium: 133 mmol/L — ABNORMAL LOW (ref 135–145)
Total Bilirubin: 0.6 mg/dL (ref 0.3–1.2)
Total Protein: 6.1 g/dL — ABNORMAL LOW (ref 6.5–8.1)

## 2022-08-27 LAB — LACTATE DEHYDROGENASE: LDH: 127 U/L (ref 98–192)

## 2022-08-27 LAB — IRON AND TIBC
Iron: 47 ug/dL (ref 45–182)
Saturation Ratios: 15 % — ABNORMAL LOW (ref 17.9–39.5)
TIBC: 311 ug/dL (ref 250–450)
UIBC: 264 ug/dL

## 2022-08-27 LAB — FERRITIN: Ferritin: 38 ng/mL (ref 24–336)

## 2022-08-27 LAB — PSA: Prostatic Specific Antigen: 0.08 ng/mL (ref 0.00–4.00)

## 2022-08-27 MED ORDER — LEUPROLIDE ACETATE (6 MONTH) 45 MG ~~LOC~~ KIT
45.0000 mg | PACK | Freq: Once | SUBCUTANEOUS | Status: AC
  Administered 2022-08-27: 45 mg via SUBCUTANEOUS
  Filled 2022-08-27: qty 45

## 2022-08-27 NOTE — Progress Notes (Signed)
Daughter is concerned with worsening energy.  New diarrhea for the past week and has taken Imodium that has helped.  Did have diarrhea episode this morning.  Increasing urination about every 15-30 minutes.  Was prescribed Oxybutynin by PCP but stopped after 5 day due to worsening hand tremors  BP 91/58, HR 90

## 2022-08-27 NOTE — Progress Notes (Signed)
[facility] NOTE  Patient Care Team: Gae Bon, NP as PCP - General (Nurse Practitioner) Cammie Sickle, MD as Consulting Physician (Oncology)  CHIEF COMPLAINTS/PURPOSE OF CONSULTATION: [name]/prostate cancer  Oncology History Overview Note  #April-MAY 2022-[name] [phenotype based on flow cytometry]; absolute neutrophil count 7000; white count 12,000 hemoglobin 13; platelets 120s. PET scan- May 2022-no evidence of any lymphadenopathy-suggestive of active [name].Marland Kitchen  Borderline enlarged spleen/normal metabolic activity; see discussion regarding left kidney [see below].   # AUG 2022-prostate adenocarcinoma Gleason score 4+5 [12 out of 12 cores]; Dr.Stoioff- SEP 12th, 2022-Eligard every 6 months  # COPD/active smoker; CAD; hx of urinary obstruction [Dr.Stoioff]; ? Left kidney UPJ obstruction ----------------------------------  HEMATOLOGY HISTORY:  # April 2022- [name] [phenotype flow cytometry]; US-splenomegaly mild.  Absolute neutrophil count 7000; hemoglobin 13; platelets 120s.   # COPD at night O2; active smoker [1-1.5ppd]; CAD/ right bundle branch block, and peripheral vascular disease [Dr.Parashoes]   [name] (chronic lymphocytic leukemia) ([place])  10/10/2020 Initial Diagnosis   [name] (chronic lymphocytic leukemia) ([place])   10/22/2020 Cancer Staging   Staging form: Chronic Lymphocytic Leukemia / Small Lymphocytic Lymphoma, AJCC 8th Edition - Clinical: Modified Rai Stage II (Modified Rai risk: Intermediate, Lymphocytosis: Present, Adenopathy: Absent, Organomegaly: Present, Anemia: Absent, Thrombocytopenia: Absent) - Signed by Cammie Sickle, MD on 10/22/2020 Stage prefix: Initial diagnosis     HISTORY OF PRESENTING ILLNESS: Elderly male patient.  Accompanied by his daughter  Spencer Buck 89 y.o.  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  Daughter is concerned with worsening energy.  New  diarrhea for the past week and has taken Imodium that has helped.  Did have diarrhea episode this morning.   Increasing urination about every 15-30 minutes.  Was prescribed Oxybutynin by PCP but stopped after 5 day due to worsening hand tremors.  No significant hot flashes. No fever no chills no hospitalizations.    Review of Systems  Constitutional:  Positive for malaise/fatigue and weight loss. Negative for chills, diaphoresis and fever.  HENT:  Negative for nosebleeds and sore throat.   Eyes:  Negative for double vision.  Respiratory:  Negative for cough, hemoptysis, sputum production, shortness of breath and wheezing.   Cardiovascular:  Negative for chest pain, palpitations, orthopnea and leg swelling.  Gastrointestinal:  Negative for abdominal pain, blood in stool, constipation, diarrhea, heartburn, melena, nausea and vomiting.  Genitourinary:  Negative for dysuria, frequency and urgency.  Musculoskeletal:  Positive for back pain and joint pain.  Skin: Negative.  Negative for itching and rash.  Neurological:  Negative for dizziness, tingling, focal weakness, weakness and headaches.  Endo/Heme/Allergies:  Does not bruise/bleed easily.  Psychiatric/Behavioral:  Negative for depression. The patient is not nervous/anxious and does not have insomnia.     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)    Nonrheumatic aortic valve disorder    PVD (peripheral vascular disease) (HCC)    Right bundle branch block    Splenomegaly    Urinary retention  SURGICAL HISTORY: Past Surgical  History:  Procedure Laterality Date   HERNIA REPAIR Left    KNEE SURGERY Left    torn ligament   PROSTATE BIOPSY N/A 01/28/2021   Procedure: PROSTATE BIOPSY;  Surgeon: Abbie Sons, MD;  Location: [facility] ORS;  Service: Urology;  Laterality: N/A;   TRANSRECTAL ULTRASOUND N/A 01/28/2021   Procedure: TRANSRECTAL ULTRASOUND;  Surgeon: Abbie Sons, MD;  Location: [facility] ORS;  Service: Urology;  Laterality: N/A;  Spoke w/[name] in ultrasound confirmed    SOCIAL HISTORY: Social History   Socioeconomic History   Marital status: Married    Spouse name: Not on file   Number of children: Not on file   Years of education: Not on file   Highest education level: Not on file  Occupational History   Not on file  Tobacco Use   Smoking status: Every Day    Packs/day: 1.50    Years: 72.00    Additional pack years: 0.00    Total pack years: 108.00    Types: Cigarettes   Smokeless tobacco: Never  Vaping Use   Vaping Use: Never used  Substance and Sexual Activity   Alcohol use: Yes    Comment: rarely   Drug use: Never   Sexual activity: Yes  Other Topics Concern   Not on file  Social History Narrative   Not on file   Social Determinants of Health   Financial Resource Strain: Not on file  Food Insecurity: Not on file  Transportation Needs: Not on file  Physical Activity: Not on file  Stress: Not on file  Social Connections: Not on file  Intimate Partner Violence: Not on file    FAMILY HISTORY: Family History  Problem Relation Age of Onset   Stroke Paternal Grandmother    Glaucoma Other    Diabetes Other     ALLERGIES:  has No Known Allergies.  MEDICATIONS:  Current Outpatient Medications  Medication Sig Dispense Refill   albuterol (ACCUNEB) 1.25 MG/3ML nebulizer solution Take 1 ampule by nebulization every 6 (six) hours as needed for wheezing.     aspirin 81 MG EC tablet Chew 81 mg by mouth at bedtime.     Budeson-Glycopyrrol-Formoterol (BREZTRI AEROSPHERE) 160-9-4.8  MCG/ACT AERO Inhale into the lungs.     Cholecalciferol (VITAMIN D) 125 MCG (5000 UT) CAPS Take 5,000 Units by mouth daily.     ferrous sulfate 325 (65 FE) MG tablet Take 325 mg by mouth daily with breakfast.     finasteride (PROSCAR) 5 MG tablet Take 5 mg by mouth daily.     MAGNESIUM GLYCINATE PO Take 500 mg by mouth daily.     Multiple Vitamin (MULTIVITAMIN) tablet Take 1 tablet by mouth daily.     OXYGEN Inhale into the lungs. At night     tamsulosin (FLOMAX) 0.4 MG CAPS capsule Take 0.4 mg by mouth in the morning and at bedtime.     [employer] CRANBERRY PO Take by mouth. (Patient not taking: Reported on 08/27/2022)     No current facility-administered medications for this visit.      PHYSICAL EXAMINATION:   Vitals:   08/27/22 1000  BP: (!) 91/58  Pulse: 90  Resp: 18  Temp: (!) 96.8 F (36 C)   Filed Weights   08/27/22 1000  Weight: 158 lb (71.7 kg)     Physical Exam Constitutional:      Comments: Elderly male patient ambulating independently.  Accompanied by his daughter  HENT:  Head: Normocephalic and atraumatic.     Mouth/Throat:     Pharynx: No oropharyngeal exudate.  Eyes:     Pupils: Pupils are equal, round, and reactive to light.  Cardiovascular:     Rate and Rhythm: Normal rate and regular rhythm.  Pulmonary:     Effort: No respiratory distress.     Breath sounds: No wheezing.     Comments: Decreased air entry bilaterally.   Abdominal:     General: Bowel sounds are normal. There is no distension.     Palpations: Abdomen is soft. There is no mass.     Tenderness: There is no abdominal tenderness. There is no guarding or rebound.     Comments: No obvious splenomegaly noted.  Musculoskeletal:        General: No tenderness. Normal range of motion.     Cervical back: Normal range of motion and neck supple.  Skin:    General: Skin is warm.  Neurological:     Mental Status: He is alert and oriented to person, place, and time.  Psychiatric:        Mood and  Affect: Affect normal.     LABORATORY DATA:  I have reviewed the data as listed Lab Results  Component Value Date   WBC 10.3 08/27/2022   HGB 11.5 (L) 08/27/2022   HCT 36.1 (L) 08/27/2022   MCV 87.2 08/27/2022   PLT 95 (L) 08/27/2022   Recent Labs    11/26/21 1331 02/26/22 1112 08/27/22 1034  NA 136 136 133*  K 4.4 5.2* 4.2  CL 100 102 102  CO2 '29 30 25  '$ GLUCOSE 83 96 96  BUN 35* 37* 36*  CREATININE 1.34* 1.24 1.13  CALCIUM 9.1 9.6 8.8*  GFRNONAA 51* 56* >60  PROT 6.9 7.0 6.1*  ALBUMIN 3.8 3.9 3.5  AST '19 22 19  '$ ALT '13 14 12  '$ ALKPHOS 76 79 66  BILITOT 0.6 0.5 0.6     No results found.   [name] (chronic lymphocytic leukemia) (HCC) #[name] [phenotype based on flow cytometry]- PET scan- May 2022-no evidence of any lymphadenopathy. No obvious evidence of progressive disease from underlying [name].   Patient's baseline white count is about 20,000; however interestingly today is normal at 10.  Hemoglobin slightly 11.5 platelets 95.    #  Mild anemia- Hb 11.5- baseline 12- ? Etiology-denies any blood in stools.  GFR 58.  However never had colonoscopy. Check iron studies.  If low consider further workup.   # Prostate cancer: [AUG 2022]-Gleason score:12/12 cores were positive for Gleason 4+5/5+4 adenocarcinoma. [not a candidate for definitive therapy surgery or radiation].  Baseline PSA 23.  On  palliative option of ADT q6M.    Tolerating well except for hot flashes.  SEP, 2023-- PSA - 0.03  Awaiting PSA from today. Stable.   # chronic Hypotension: Stable.   # Hot flashes-grade 1-2 from Eligard-  stable.   # CKD stage II-stable [chronic urinary obstruction-? straight cathing-improved on Eligard]- stable.   # COPD-Active smoker: stable.  Chest x-ray November 2023 no concern for malignancy.  eligard q6M  # DISPOSITION:  # add iron studies/ferritin; LDH # Eliagrd today # follow up in 6 months MD; labs- cbc/cmp;PSA;LDH; iron studies;ferretin- ELIGARD-Dr.B   All questions  were answered. The patient knows to call the clinic with any problems, questions or concerns.   Cammie Sickle, MD 08/27/2022 12:09 PM

## 2022-08-27 NOTE — Assessment & Plan Note (Addendum)
#  [name] [phenotype based on flow cytometry]- PET scan- May 2022-no evidence of any lymphadenopathy. No obvious evidence of progressive disease from underlying [name].   Patient's baseline white count is about 20,000; however interestingly today is normal at 10.  Hemoglobin slightly 11.5 platelets 95.    #  Mild anemia- Hb 11.5- baseline 12- ? Etiology-denies any blood in stools.  GFR 58.  However never had colonoscopy. Check iron studies.  If low consider further workup.   # Prostate cancer: [AUG 2022]-Gleason score:12/12 cores were positive for Gleason 4+5/5+4 adenocarcinoma. [not a candidate for definitive therapy surgery or radiation].  Baseline PSA 23.  On  palliative option of ADT q6M.    Tolerating well except for hot flashes.  SEP, 2023-- PSA - 0.03  Awaiting PSA from today. Stable.   # chronic Hypotension: Stable.   # Hot flashes-grade 1-2 from Eligard-  stable.   # CKD stage II-stable [chronic urinary obstruction-? straight cathing-improved on Eligard]- stable.   # COPD-Active smoker: stable.  Chest x-ray November 2023 no concern for malignancy.  eligard q6M  # DISPOSITION:  # add iron studies/ferritin; LDH # Eliagrd today # follow up in 6 months MD; labs- cbc/cmp;PSA;LDH; iron studies;ferretin- ELIGARD-Dr.B

## 2022-08-28 ENCOUNTER — Telehealth: Payer: Self-pay

## 2022-08-28 ENCOUNTER — Ambulatory Visit: Payer: Medicare PPO | Admitting: Internal Medicine

## 2022-08-28 ENCOUNTER — Ambulatory Visit: Payer: Medicare PPO

## 2022-08-28 ENCOUNTER — Other Ambulatory Visit: Payer: Medicare PPO

## 2022-08-28 NOTE — Telephone Encounter (Signed)
-----   Message from Cammie Sickle, MD sent at 08/27/2022  4:13 PM EDT ----- Please inform patient/ daughter that his iron level slightly low recommend over-the-counter gentle iron tablets a day.  Again, we will reach labs at next visit.  Thanks, GB

## 2022-08-28 NOTE — Telephone Encounter (Addendum)
Patient's daughter aware of results.  Patient is already taking OTC iron QD.  Does he need to take any additional?

## 2022-08-31 ENCOUNTER — Encounter: Payer: Self-pay | Admitting: Internal Medicine

## 2022-08-31 NOTE — Telephone Encounter (Signed)
Patient daughter is aware

## 2022-09-02 ENCOUNTER — Ambulatory Visit: Payer: Medicare PPO | Admitting: Urology

## 2022-09-02 ENCOUNTER — Encounter: Payer: Self-pay | Admitting: Urology

## 2022-09-02 VITALS — BP 107/62 | HR 88 | Ht 71.0 in | Wt 150.0 lb

## 2022-09-02 DIAGNOSIS — C61 Malignant neoplasm of prostate: Secondary | ICD-10-CM | POA: Diagnosis not present

## 2022-09-02 DIAGNOSIS — R351 Nocturia: Secondary | ICD-10-CM

## 2022-09-02 LAB — MICROSCOPIC EXAMINATION: WBC, UA: >30 /hpf — AB (ref 0–5)

## 2022-09-02 LAB — URINALYSIS, COMPLETE
Bilirubin, UA: NEGATIVE
Glucose, UA: NEGATIVE
Ketones, UA: NEGATIVE
Nitrite, UA: NEGATIVE
Specific Gravity, UA: 1.02 (ref 1.005–1.030)
Urobilinogen, Ur: 0.2 mg/dL (ref 0.2–1.0)
pH, UA: 7.5 (ref 5.0–7.5)

## 2022-09-02 LAB — BLADDER SCAN AMB NON-IMAGING: Scan Result: 14

## 2022-09-02 MED ORDER — TROSPIUM CHLORIDE 20 MG PO TABS: ORAL_TABLET | ORAL | 0 refills | Status: DC

## 2022-09-02 NOTE — Addendum Note (Signed)
Addended by: Chrystie Nose on: 09/02/2022 02:54 PM   Modules accepted: Orders

## 2022-09-02 NOTE — Progress Notes (Signed)
09/02/2022 1:05 PM   Spencer Buck 03/03/1931 [id number]  Referring provider: Gae Bon, NP [address] [place],  [place] [address]  Chief Complaint  Patient presents with   Urinary Frequency    Urologic history:  1.  Prostate cancer Biopsy August 2022; PSA 23.2/abnormal DRE 12/12 cores + Gleason 4+5/5+4 adenocarcinoma No bony metastatic disease On leuprolide and followed by medical oncology  2.  Left hydronephrosis Lasix renal scan 34% function left kidney; no evidence of obstruction  3.  History urinary retention Previously on intermittent catheterization  HPI: 89 y.o. male presents for evaluation of nocturia at the request of his PCP  Recently developed nocturia q. 45-60 minutes and when he gets up to void has urinary hesitancy and a slow stream In the a.m. he is able to void without problems and has no daytime voiding symptoms Oxybutynin 15 mg extended release was prescribed by his PCP which improved his symptoms but unable to tolerate secondary to increasing tremors which resolved after stopping the medication No or gross hematuria Denies flank, abdominal or pelvic pain Last PSA in oncology 08/27/2022 0.08   PMH: Past Medical History:  Diagnosis Date   Anemia    Aortic atherosclerosis (HCC)    Biatrial enlargement    BPH (benign prostatic hyperplasia)    CAD (coronary artery disease)    Cardiomegaly    [name] (chronic lymphocytic leukemia) (HCC)    COPD (chronic obstructive pulmonary disease) (HCC)    Diastolic heart failure (HCC)    Nicotine dependence, cigarettes, uncomplicated    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 ([place])    Nonrheumatic aortic valve disorder    PVD (peripheral vascular disease) ([place])    Right bundle branch block    Splenomegaly    Urinary retention     Surgical History: Past Surgical History:  Procedure Laterality Date   HERNIA REPAIR Left    KNEE SURGERY Left    torn ligament   PROSTATE BIOPSY N/A  01/28/2021   Procedure: PROSTATE BIOPSY;  Surgeon: Abbie Sons, MD;  Location: [facility] ORS;  Service: Urology;  Laterality: N/A;   TRANSRECTAL ULTRASOUND N/A 01/28/2021   Procedure: TRANSRECTAL ULTRASOUND;  Surgeon: Abbie Sons, MD;  Location: [facility] ORS;  Service: Urology;  Laterality: N/A;  Spoke w/[name] in ultrasound confirmed    Home Medications:  Allergies as of 09/02/2022   No Known Allergies      Medication List        Accurate as of September 02, 2022  1:05 PM. If you have any questions, ask your nurse or doctor.          albuterol 1.25 MG/3ML nebulizer solution Commonly known as: ACCUNEB Take 1 ampule by nebulization every 6 (six) hours as needed for wheezing.   aspirin EC 81 MG tablet Chew 81 mg by mouth at bedtime.   Breztri Aerosphere 160-9-4.8 MCG/ACT Aero Generic drug: Budeson-Glycopyrrol-Formoterol Inhale into the lungs.   [employer] CRANBERRY PO Take by mouth.   ferrous sulfate 325 (65 FE) MG tablet Take 325 mg by mouth daily with breakfast.   finasteride 5 MG tablet Commonly known as: PROSCAR Take 5 mg by mouth daily.   MAGNESIUM GLYCINATE PO Take 500 mg by mouth daily.   multivitamin tablet Take 1 tablet by mouth daily.   OXYGEN Inhale into the lungs. At night   tamsulosin 0.4 MG Caps capsule Commonly known as: FLOMAX Take 0.4 mg by mouth in the morning and at bedtime.   trospium 20 MG  tablet Commonly known as: SANCTURA Take 1 tab 1 hour prior to bedtime Started by: Abbie Sons, MD   Vitamin D 125 MCG (5000 UT) Caps Take 5,000 Units by mouth daily.        Allergies: No Known Allergies  Family History: Family History  Problem Relation Age of Onset   Stroke Paternal Grandmother    Glaucoma Other    Diabetes Other     Social History:  reports that he has been smoking cigarettes. He has a 108.00 pack-year smoking history. He has never used smokeless tobacco. He reports current alcohol use. He reports that he does not use  drugs.   Physical Exam: BP 107/62   Pulse 88   Ht 5\' 11"  (1.803 m)   Wt 150 lb (68 kg)   BMI 20.92 kg/m   Constitutional:  Alert and oriented, No acute distress. HEENT: [place] AT Respiratory: Normal respiratory effort, no increased work of breathing. Psychiatric: Normal mood and affect.   Assessment & Plan:    1.  Nocturia PVR 14 mL Had improvement with extended release oxybutynin but could not tolerate secondary to side effects Trial immediate release trospium 20 mg 1 hour prior to bedtime  2.  High risk prostate cancer Last PSA 0.86 Continue medical oncology follow-up   Abbie Sons, MD  [facility] [address] [place], [place] [address] [phone]

## 2022-09-07 ENCOUNTER — Telehealth: Payer: Self-pay

## 2022-09-07 NOTE — Telephone Encounter (Signed)
Pt states trospium is too expensive. 89.00  Is there something else he can take.   Pls advise.   Pt aware it will be Wed before [facility] can respond.

## 2022-09-08 ENCOUNTER — Telehealth: Payer: Self-pay | Admitting: *Deleted

## 2022-09-08 ENCOUNTER — Other Ambulatory Visit: Payer: Self-pay | Admitting: *Deleted

## 2022-09-08 LAB — CULTURE, URINE COMPREHENSIVE

## 2022-09-08 MED ORDER — DOXYCYCLINE HYCLATE 100 MG PO TABS: 100.0000 mg | ORAL_TABLET | Freq: Two times a day (BID) | ORAL | 0 refills | Status: AC

## 2022-09-08 MED ORDER — TROSPIUM CHLORIDE 20 MG PO TABS: ORAL_TABLET | ORAL | 0 refills | Status: DC

## 2022-09-08 NOTE — Telephone Encounter (Signed)
-----   Message from Abbie Sons, MD sent at 09/08/2022  3:03 PM EDT ----- Urine culture was positive for bacteria.  Please send in Rx doxycycline 100 mg twice daily x 10 days to see if he notes any improvement in his urinary symptoms

## 2022-09-08 NOTE — Telephone Encounter (Signed)
Notified patient daughter as instructed,  

## 2022-09-08 NOTE — Telephone Encounter (Signed)
Notified patient  daughter as instructed, patient pleased.   

## 2022-09-08 NOTE — Telephone Encounter (Signed)
Any other less expensive medications are going to have side effects and would not recommend.  He can get 1 months worth of trospium at [facility] for $34 and [facility] for $25 using good Rx and not going through insurance

## 2022-11-30 ENCOUNTER — Other Ambulatory Visit: Payer: Self-pay

## 2022-11-30 DIAGNOSIS — C9110 Chronic lymphocytic leukemia of B-cell type not having achieved remission: Secondary | ICD-10-CM

## 2022-12-01 ENCOUNTER — Encounter: Payer: Self-pay | Admitting: Nurse Practitioner

## 2022-12-01 ENCOUNTER — Inpatient Hospital Stay (HOSPITAL_BASED_OUTPATIENT_CLINIC_OR_DEPARTMENT_OTHER): Payer: Medicare PPO | Admitting: Nurse Practitioner

## 2022-12-01 ENCOUNTER — Inpatient Hospital Stay: Payer: Medicare PPO | Attending: Internal Medicine

## 2022-12-01 VITALS — BP 106/54 | HR 68 | Temp 97.5°F | Wt 154.4 lb

## 2022-12-01 DIAGNOSIS — C9110 Chronic lymphocytic leukemia of B-cell type not having achieved remission: Secondary | ICD-10-CM

## 2022-12-01 DIAGNOSIS — C61 Malignant neoplasm of prostate: Secondary | ICD-10-CM

## 2022-12-01 LAB — COMPREHENSIVE METABOLIC PANEL
ALT: 12 U/L (ref 0–44)
AST: 22 U/L (ref 15–41)
Albumin: 3.7 g/dL (ref 3.5–5.0)
Alkaline Phosphatase: 68 U/L (ref 38–126)
Anion gap: 6 (ref 5–15)
BUN: 31 mg/dL — ABNORMAL HIGH (ref 8–23)
CO2: 27 mmol/L (ref 22–32)
Calcium: 8.9 mg/dL (ref 8.9–10.3)
Chloride: 105 mmol/L (ref 98–111)
Creatinine, Ser: 1.19 mg/dL (ref 0.61–1.24)
GFR, Estimated: 58 mL/min — ABNORMAL LOW (ref >60)
Glucose, Bld: 90 mg/dL (ref 70–99)
Potassium: 4.2 mmol/L (ref 3.5–5.1)
Sodium: 138 mmol/L (ref 135–145)
Total Bilirubin: 0.7 mg/dL (ref 0.3–1.2)
Total Protein: 6.2 g/dL — ABNORMAL LOW (ref 6.5–8.1)

## 2022-12-01 LAB — CBC WITH DIFFERENTIAL/PLATELET
Abs Immature Granulocytes: 0.05 10*3/uL (ref 0.00–0.07)
Basophils Absolute: 0.1 10*3/uL (ref 0.0–0.1)
Basophils Relative: 0 %
Eosinophils Absolute: 0.4 10*3/uL (ref 0.0–0.5)
Eosinophils Relative: 1 %
HCT: 39.3 % (ref 39.0–52.0)
Hemoglobin: 12.4 g/dL — ABNORMAL LOW (ref 13.0–17.0)
Immature Granulocytes: 0 %
Lymphocytes Relative: 83 %
Lymphs Abs: 24.6 10*3/uL — ABNORMAL HIGH (ref 0.7–4.0)
MCH: 27.3 pg (ref 26.0–34.0)
MCHC: 31.6 g/dL (ref 30.0–36.0)
MCV: 86.4 fL (ref 80.0–100.0)
Monocytes Absolute: 1.8 10*3/uL — ABNORMAL HIGH (ref 0.1–1.0)
Monocytes Relative: 6 %
Neutro Abs: 2.9 10*3/uL (ref 1.7–7.7)
Neutrophils Relative %: 10 %
Platelets: 75 10*3/uL — ABNORMAL LOW (ref 150–400)
RBC: 4.55 MIL/uL (ref 4.22–5.81)
RDW: 15.5 % (ref 11.5–15.5)
Smear Review: NORMAL
WBC Morphology: ABNORMAL
WBC: 29.8 10*3/uL — ABNORMAL HIGH (ref 4.0–10.5)
nRBC: 0.2 % (ref 0.0–0.2)

## 2022-12-01 LAB — LACTATE DEHYDROGENASE: LDH: 135 U/L (ref 98–192)

## 2022-12-01 NOTE — Progress Notes (Signed)
[facility] CONSULT NOTE  Patient Care Team: Franciso Bend, NP as PCP - General (Nurse Practitioner) Earna Coder, MD as Consulting Physician (Oncology)  CHIEF COMPLAINTS/PURPOSE OF CONSULTATION: [name]/prostate cancer  Oncology History Overview Note  #April-MAY 2022-[name] [phenotype based on flow cytometry]; absolute neutrophil count 7000; white count 12,000 hemoglobin 13; platelets 120s. PET scan- May 2022-no evidence of any lymphadenopathy-suggestive of active [name].Marland Kitchen  Borderline enlarged spleen/normal metabolic activity; see discussion regarding left kidney [see below].   # AUG 2022-prostate adenocarcinoma Gleason score 4+5 [12 out of 12 cores]; Dr.Stoioff- [facility] 12th, 2022-Eligard every 6 months  # COPD/active smoker; CAD; hx of urinary obstruction [Dr.Stoioff]; ? Left kidney UPJ obstruction ----------------------------------  HEMATOLOGY HISTORY:  # April 2022- [name] [phenotype flow cytometry]; US-splenomegaly mild.  Absolute neutrophil count 7000; hemoglobin 13; platelets 120s.   # COPD at night O2; active smoker [1-1.5ppd]; CAD/ right bundle branch block, and peripheral vascular disease [Dr.Parashoes]   [name] (chronic lymphocytic leukemia) (HCC)  10/10/2020 Initial Diagnosis   [name] (chronic lymphocytic leukemia) (HCC)   10/22/2020 Cancer Staging   Staging form: Chronic Lymphocytic Leukemia / Small Lymphocytic Lymphoma, AJCC 8th Edition - Clinical: Modified Rai Stage II (Modified Rai risk: Intermediate, Lymphocytosis: Present, Adenopathy: Absent, Organomegaly: Present, Anemia: Absent, Thrombocytopenia: Absent) - Signed by Earna Coder, MD on 10/22/2020 Stage prefix: Initial diagnosis     HISTORY OF PRESENTING ILLNESS: Elderly male patient.  Accompanied by his daughter  Lewie Deman 89 y.o. male, diagnosed with [name], currently on surveillance, and castrate sensitive prostate cancer, on palliative ADT, who returns to clinic for interval follow up. He has  been taking oral iron. Energy has improved. No additional diarrhea. Is now on flomax. Increased urination has improved. Hot flashes are stable and ongoing. No worsening bone pain. No fevers, chills, recent infections, or hospitalizations.   Review of Systems  Constitutional:  Positive for malaise/fatigue. Negative for chills, diaphoresis, fever and weight loss.  HENT:  Negative for nosebleeds and sore throat.   Eyes:  Negative for double vision.  Respiratory:  Negative for cough, hemoptysis, sputum production, shortness of breath and wheezing.   Cardiovascular:  Negative for chest pain, palpitations, orthopnea and leg swelling.  Gastrointestinal:  Negative for abdominal pain, blood in stool, constipation, diarrhea, heartburn, melena, nausea and vomiting.  Genitourinary:  Positive for frequency. Negative for dysuria, flank pain, hematuria and urgency.  Musculoskeletal:  Positive for back pain and joint pain.  Skin: Negative.  Negative for itching and rash.  Neurological:  Negative for dizziness, tingling, focal weakness, weakness and headaches.  Endo/Heme/Allergies:  Does not bruise/bleed easily.  Psychiatric/Behavioral:  Negative for depression. The patient is not nervous/anxious and does not have insomnia.     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)    Nonrheumatic aortic valve disorder    PVD (peripheral vascular disease) (HCC)    Right bundle branch block    Splenomegaly    Urinary retention     SURGICAL HISTORY: Past Surgical History:  Procedure Laterality Date   HERNIA REPAIR  Left  KNEE SURGERY Left    torn ligament   PROSTATE BIOPSY N/A 01/28/2021   Procedure: PROSTATE BIOPSY;  Surgeon: Riki Altes, MD;  Location: [facility] ORS;  Service: Urology;  Laterality: N/A;   TRANSRECTAL ULTRASOUND N/A 01/28/2021   Procedure: TRANSRECTAL ULTRASOUND;  Surgeon: Riki Altes, MD;  Location: [facility] ORS;  Service: Urology;  Laterality: N/A;  Spoke w/[name] in ultrasound confirmed    SOCIAL HISTORY: Social History   Socioeconomic History   Marital status: Married    Spouse name: Not on file   Number of children: Not on file   Years of education: Not on file   Highest education level: Not on file  Occupational History   Not on file  Tobacco Use   Smoking status: Every Day    Packs/day: 1.50    Years: 72.00    Additional pack years: 0.00    Total pack years: 108.00    Types: Cigarettes   Smokeless tobacco: Never  Vaping Use   Vaping Use: Never used  Substance and Sexual Activity   Alcohol use: Yes    Comment: rarely   Drug use: Never   Sexual activity: Yes  Other Topics Concern   Not on file  Social History Narrative   Not on file   Social Determinants of Health   Financial Resource Strain: Not on file  Food Insecurity: Not on file  Transportation Needs: Not on file  Physical Activity: Not on file  Stress: Not on file  Social Connections: Not on file  Intimate Partner Violence: Not on file    FAMILY HISTORY: Family History  Problem Relation Age of Onset   Stroke Paternal Grandmother    Glaucoma Other    Diabetes Other     ALLERGIES:  has No Known Allergies.  MEDICATIONS:  Current Outpatient Medications  Medication Sig Dispense Refill   albuterol (ACCUNEB) 1.25 MG/3ML nebulizer solution Take 1 ampule by nebulization every 6 (six) hours as needed for wheezing.     aspirin 81 MG EC tablet Chew 81 mg by mouth at bedtime.     Budeson-Glycopyrrol-Formoterol (BREZTRI AEROSPHERE) 160-9-4.8 MCG/ACT AERO Inhale into the lungs.     Cholecalciferol  (VITAMIN D) 125 MCG (5000 UT) CAPS Take 5,000 Units by mouth daily.     [employer] CRANBERRY PO Take by mouth.     ferrous sulfate 325 (65 FE) MG tablet Take 325 mg by mouth daily with breakfast.     finasteride (PROSCAR) 5 MG tablet Take 5 mg by mouth daily.     MAGNESIUM GLYCINATE PO Take 500 mg by mouth daily.     Multiple Vitamin (MULTIVITAMIN) tablet Take 1 tablet by mouth daily.     OXYGEN Inhale into the lungs. At night     tamsulosin (FLOMAX) 0.4 MG CAPS capsule Take 0.4 mg by mouth in the morning and at bedtime.     trospium (SANCTURA) 20 MG tablet Take 1 tab 1 hour prior to bedtime 30 tablet 0   No current facility-administered medications for this visit.      PHYSICAL EXAMINATION: Vitals:   12/01/22 1055  BP: (!) 106/54  Pulse: 68  Temp: (!) 97.5 F (36.4 C)  SpO2: 95%   Filed Weights   12/01/22 1055  Weight: 154 lb 6.4 oz (70 kg)   Physical Exam Constitutional:      Comments: Elderly male patient ambulating independently.  Accompanied by his daughter  HENT:     Head: Normocephalic and atraumatic.  Cardiovascular:  Rate and Rhythm: Normal rate and regular rhythm.  Pulmonary:     Effort: No respiratory distress.     Breath sounds: No wheezing.     Comments: Decreased air entry bilaterally.   Abdominal:     General: There is no distension.     Palpations: Abdomen is soft. There is no mass.     Tenderness: There is no abdominal tenderness. There is no guarding or rebound.     Comments: No obvious splenomegaly noted.  Musculoskeletal:        General: No tenderness. Normal range of motion.  Skin:    General: Skin is warm.     Coloration: Skin is not pale.  Neurological:     Mental Status: He is alert and oriented to person, place, and time.  Psychiatric:        Mood and Affect: Mood and affect normal.        Behavior: Behavior normal.     LABORATORY DATA:  I have reviewed the data as listed Lab Results  Component Value Date   WBC 29.8 (H) 12/01/2022    HGB 12.4 (L) 12/01/2022   HCT 39.3 12/01/2022   MCV 86.4 12/01/2022   PLT 75 (L) 12/01/2022   Recent Labs    02/26/22 1112 08/27/22 1034 12/01/22 1026  NA 136 133* 138  K 5.2* 4.2 4.2  CL 102 102 105  CO2 30 25 27   GLUCOSE 96 96 90  BUN 37* 36* 31*  CREATININE 1.24 1.13 1.19  CALCIUM 9.6 8.8* 8.9  GFRNONAA 56* >60 58*  PROT 7.0 6.1* 6.2*  ALBUMIN 3.9 3.5 3.7  AST 22 19 22   ALT 14 12 12   ALKPHOS 79 66 68  BILITOT 0.5 0.6 0.7      No results found.  Assessment & Plan:  #[name] [phenotype based on flow cytometry]- PET scan- May 2022-no evidence of any lymphadenopathy. No obvious evidence of progressive disease from underlying [name]. Baseline wbc around 20,000. Previously decreased to 10k. Today, more consistent with baseline of 29k. Clinically asymptomatic of progression. Recommend continued surveillance.   # Anemia- mild. Baseline 12. Never had colonoscopy. Started oral iron due to borderline iron levels in March 2024. Iron sat 15%, ferritin 38. Today, hemoglobin has improved to 12.4. Symptomatically improved. Continue oral iron. Repeat iron studies at next visit.   # Prostate Cancer: Aug 2022- Gleason score: 12/12 cores were positive for Gleason 4+5/5+4 adenocarcinoma. Not a candidate for definitive therapy surgery or radiation. Baseline PSA 23. On palliative ADT q18m. Tolerating well except for hot flashes. PSA 0.16 August 2022. Monitor.   # Hot flashes- suspect due to adt based on timing. Monitor.     # Chronic Hypotension: Stable.     # CKD stage II-stable [chronic urinary obstruction-? straight cathing-improved on Eligard]- stable.    # COPD-Active smoker: stable.  Chest x-ray November 2023 no concern for malignancy.   eligard q6M   # DISPOSITION:  Follow up with Dr Donneta Romberg in 3 months with labs (cbc, cmp, ferritin, iron studies, psa, ldh), +/- Eligard- la  No problem-specific Assessment & Plan notes found for this encounter. All questions were answered. The patient  knows to call the clinic with any problems, questions or concerns.   Alinda Dooms, NP 12/01/2022

## 2022-12-30 ENCOUNTER — Encounter: Payer: Self-pay | Admitting: Internal Medicine

## 2023-02-25 ENCOUNTER — Inpatient Hospital Stay: Payer: Medicare PPO | Attending: Internal Medicine

## 2023-02-25 ENCOUNTER — Inpatient Hospital Stay: Payer: Medicare PPO | Admitting: Internal Medicine

## 2023-02-25 ENCOUNTER — Encounter: Payer: Self-pay | Admitting: Internal Medicine

## 2023-02-25 ENCOUNTER — Inpatient Hospital Stay: Payer: Medicare PPO

## 2023-02-25 VITALS — BP 102/55 | HR 81 | Temp 97.7°F | Ht 71.0 in | Wt 154.1 lb

## 2023-02-25 DIAGNOSIS — C9110 Chronic lymphocytic leukemia of B-cell type not having achieved remission: Secondary | ICD-10-CM

## 2023-02-25 DIAGNOSIS — C61 Malignant neoplasm of prostate: Secondary | ICD-10-CM

## 2023-02-25 DIAGNOSIS — F17210 Nicotine dependence, cigarettes, uncomplicated: Secondary | ICD-10-CM | POA: Insufficient documentation

## 2023-02-25 DIAGNOSIS — D649 Anemia, unspecified: Secondary | ICD-10-CM | POA: Diagnosis not present

## 2023-02-25 DIAGNOSIS — Z7982 Long term (current) use of aspirin: Secondary | ICD-10-CM | POA: Insufficient documentation

## 2023-02-25 DIAGNOSIS — Z79899 Other long term (current) drug therapy: Secondary | ICD-10-CM | POA: Diagnosis not present

## 2023-02-25 LAB — CMP (CANCER CENTER ONLY)
ALT: 13 U/L (ref 0–44)
AST: 22 U/L (ref 15–41)
Albumin: 3.9 g/dL (ref 3.5–5.0)
Alkaline Phosphatase: 86 U/L (ref 38–126)
Anion gap: 7 (ref 5–15)
BUN: 25 mg/dL — ABNORMAL HIGH (ref 8–23)
CO2: 29 mmol/L (ref 22–32)
Calcium: 9.2 mg/dL (ref 8.9–10.3)
Chloride: 101 mmol/L (ref 98–111)
Creatinine: 1.04 mg/dL (ref 0.61–1.24)
GFR, Estimated: >60 mL/min (ref >60)
Glucose, Bld: 148 mg/dL — ABNORMAL HIGH (ref 70–99)
Potassium: 4.4 mmol/L (ref 3.5–5.1)
Sodium: 137 mmol/L (ref 135–145)
Total Bilirubin: 0.6 mg/dL (ref 0.3–1.2)
Total Protein: 6.8 g/dL (ref 6.5–8.1)

## 2023-02-25 LAB — IRON AND TIBC
Iron: 62 ug/dL (ref 45–182)
Saturation Ratios: 18 % (ref 17.9–39.5)
TIBC: 354 ug/dL (ref 250–450)
UIBC: 292 ug/dL

## 2023-02-25 LAB — CBC WITH DIFFERENTIAL (CANCER CENTER ONLY)
Abs Immature Granulocytes: 0.05 10*3/uL (ref 0.00–0.07)
Basophils Absolute: 0.1 10*3/uL (ref 0.0–0.1)
Basophils Relative: 0 %
Eosinophils Absolute: 0.2 10*3/uL (ref 0.0–0.5)
Eosinophils Relative: 1 %
HCT: 38.4 % — ABNORMAL LOW (ref 39.0–52.0)
Hemoglobin: 12.1 g/dL — ABNORMAL LOW (ref 13.0–17.0)
Immature Granulocytes: 0 %
Lymphocytes Relative: 76 %
Lymphs Abs: 22 10*3/uL — ABNORMAL HIGH (ref 0.7–4.0)
MCH: 28.6 pg (ref 26.0–34.0)
MCHC: 31.5 g/dL (ref 30.0–36.0)
MCV: 90.8 fL (ref 80.0–100.0)
Monocytes Absolute: 2.8 10*3/uL — ABNORMAL HIGH (ref 0.1–1.0)
Monocytes Relative: 10 %
Neutro Abs: 3.9 10*3/uL (ref 1.7–7.7)
Neutrophils Relative %: 13 %
Platelet Count: 63 10*3/uL — ABNORMAL LOW (ref 150–400)
RBC: 4.23 MIL/uL (ref 4.22–5.81)
RDW: 15 % (ref 11.5–15.5)
Smear Review: NORMAL
WBC Count: 29.1 10*3/uL — ABNORMAL HIGH (ref 4.0–10.5)
nRBC: 0 % (ref 0.0–0.2)

## 2023-02-25 LAB — PSA: Prostatic Specific Antigen: 0.02 ng/mL (ref 0.00–4.00)

## 2023-02-25 LAB — LACTATE DEHYDROGENASE: LDH: 139 U/L (ref 98–192)

## 2023-02-25 LAB — FERRITIN: Ferritin: 41 ng/mL (ref 24–336)

## 2023-02-25 MED ORDER — LEUPROLIDE ACETATE (6 MONTH) 45 MG ~~LOC~~ KIT
45.0000 mg | PACK | Freq: Once | SUBCUTANEOUS | Status: AC
  Administered 2023-02-25: 45 mg via SUBCUTANEOUS

## 2023-02-25 NOTE — Assessment & Plan Note (Addendum)
#  [name] [phenotype based on flow cytometry]- PET scan- May 2022-no evidence of any lymphadenopathy. No obvious evidence of progressive disease from underlying [name].   # Patient's white count is about 29,000; Hemoglobin slightly low at 12.3  platelets 60s- mild progression noted- ? Fatigue- recommend PET scan for further evaluation.     #  Mild anemia- Hb-  12- ? Etiology-denies any blood in stools.  GFR 58.  However never had colonoscopy. On Vitron C- iron once a day- await labs from today.   # Prostate cancer: [AUG 2022]-Gleason score:12/12 cores were positive for Gleason 4+5/5+4 adenocarcinoma. [not a candidate for definitive therapy surgery or radiation].  Baseline PSA 23.  On  palliative option of ADT q6M.    Tolerating well except for hot flashes.  MARCH PSA - 0.0.08. Awaiting PSA from today. Stable.   # chronic Hypotension: Stable.   # Hot flashes-grade 1-2 from Eligard-  stable.   # CKD stage II-stable [chronic urinary obstruction-? straight cathing-improved on Eligard]- stable.   # COPD-Active smoker: stable.  Chest x-ray November 2023 no concern for malignancy.  eligard q6M- last 02/25/2023  # DISPOSITION:  # Eligard today # follow up in 3 months MD; labs- cbc/cmp;PSA;LDH; iron studies;ferretin- PET scan-Dr.B

## 2023-02-25 NOTE — Progress Notes (Signed)
Cough/congestion/sinus congestion a few days. Uses O2 at bedtime.  Gets tired after 30 minutes of activity.  PCP said he's anemic. Started hydrochlorothiazide 12.5mg  every other day.

## 2023-02-25 NOTE — Progress Notes (Signed)
[facility] CONSULT NOTE  Patient Care Team: Franciso Bend, NP as PCP - General (Nurse Practitioner) Earna Coder, MD as Consulting Physician (Oncology)  CHIEF COMPLAINTS/PURPOSE OF CONSULTATION: [name]/prostate cancer  Oncology History Overview Note  #April-MAY 2022-[name] [phenotype based on flow cytometry]; absolute neutrophil count 7000; white count 12,000 hemoglobin 13; platelets 120s. PET scan- May 2022-no evidence of any lymphadenopathy-suggestive of active [name].Marland Kitchen  Borderline enlarged spleen/normal metabolic activity; see discussion regarding left kidney [see below].   # AUG 2022-prostate adenocarcinoma Gleason score 4+5 [12 out of 12 cores]; Dr.Stoioff- [facility] 12th, 2022-Eligard every 6 months  # COPD/active smoker; CAD; hx of urinary obstruction [Dr.Stoioff]; ? Left kidney UPJ obstruction ----------------------------------  HEMATOLOGY HISTORY:  # April 2022- [name] [phenotype flow cytometry]; US-splenomegaly mild.  Absolute neutrophil count 7000; hemoglobin 13; platelets 120s.   # COPD at night O2; active smoker [1-1.5ppd]; CAD/ right bundle branch block, and peripheral vascular disease [Dr.Parashoes]   [name] (chronic lymphocytic leukemia) (HCC)  10/10/2020 Initial Diagnosis   [name] (chronic lymphocytic leukemia) (HCC)   10/22/2020 Cancer Staging   Staging form: Chronic Lymphocytic Leukemia / Small Lymphocytic Lymphoma, AJCC 8th Edition - Clinical: Modified Rai Stage II (Modified Rai risk: Intermediate, Lymphocytosis: Present, Adenopathy: Absent, Organomegaly: Present, Anemia: Absent, Thrombocytopenia: Absent) - Signed by Earna Coder, MD on 10/22/2020 Stage prefix: Initial diagnosis     HISTORY OF PRESENTING ILLNESS: Elderly male patient.  Accompanied by his daughter  Spencer Buck 89 y.o.  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  Cough/congestion/sinus congestion a few days.  Uses O2 at bedtime. Unfortunately continues to smoke.    Gets tired after 30 minutes of activity. PCP said he's anemic. Started hydrochlorothiazide 12.5mg  every other day.   Daughter is concerned with worsening energy.  No significant hot flashes. No fever no chills no hospitalizations.    Review of Systems  Constitutional:  Positive for malaise/fatigue and weight loss. Negative for chills, diaphoresis and fever.  HENT:  Negative for nosebleeds and sore throat.   Eyes:  Negative for double vision.  Respiratory:  Negative for cough, hemoptysis, sputum production, shortness of breath and wheezing.   Cardiovascular:  Negative for chest pain, palpitations, orthopnea and leg swelling.  Gastrointestinal:  Negative for abdominal pain, blood in stool, constipation, diarrhea, heartburn, melena, nausea and vomiting.  Genitourinary:  Negative for dysuria, frequency and urgency.  Musculoskeletal:  Positive for back pain and joint pain.  Skin: Negative.  Negative for itching and rash.  Neurological:  Negative for dizziness, tingling, focal weakness, weakness and headaches.  Endo/Heme/Allergies:  Does not bruise/bleed easily.  Psychiatric/Behavioral:  Negative for depression. The patient is not nervous/anxious and does not have insomnia.     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)    Nonrheumatic aortic valve disorder    PVD (peripheral vascular disease) (HCC)    Right bundle branch block    Splenomegaly    Urinary retention     SURGICAL HISTORY: Past Surgical History:  Procedure Laterality  Date  HERNIA REPAIR Left    KNEE SURGERY Left    torn ligament   PROSTATE BIOPSY N/A 01/28/2021   Procedure: PROSTATE BIOPSY;  Surgeon: Riki Altes, MD;  Location: [facility] ORS;  Service: Urology;  Laterality: N/A;   TRANSRECTAL ULTRASOUND N/A 01/28/2021   Procedure: TRANSRECTAL ULTRASOUND;  Surgeon: Riki Altes, MD;  Location: [facility] ORS;  Service: Urology;  Laterality: N/A;  Spoke w/[name] in ultrasound confirmed    SOCIAL HISTORY: Social History   Socioeconomic History   Marital status: Married    Spouse name: Not on file   Number of children: Not on file   Years of education: Not on file   Highest education level: Not on file  Occupational History   Not on file  Tobacco Use   Smoking status: Every Day    Current packs/day: 1.50    Average packs/day: 1.5 packs/day for 72.0 years (108.0 ttl pk-yrs)    Types: Cigarettes   Smokeless tobacco: Never  Vaping Use   Vaping status: Never Used  Substance and Sexual Activity   Alcohol use: Yes    Comment: rarely   Drug use: Never   Sexual activity: Yes  Other Topics Concern   Not on file  Social History Narrative   Not on file   Social Determinants of Health   Financial Resource Strain: Not on file  Food Insecurity: Not on file  Transportation Needs: Not on file  Physical Activity: Not on file  Stress: Not on file  Social Connections: Not on file  Intimate Partner Violence: Not on file    FAMILY HISTORY: Family History  Problem Relation Age of Onset   Stroke Paternal Grandmother    Glaucoma Other    Diabetes Other     ALLERGIES:  has No Known Allergies.  MEDICATIONS:  Current Outpatient Medications  Medication Sig Dispense Refill   albuterol (ACCUNEB) 1.25 MG/3ML nebulizer solution Take 1 ampule by nebulization every 6 (six) hours as needed for wheezing.     aspirin 81 MG EC tablet Chew 81 mg by mouth at bedtime.     Budeson-Glycopyrrol-Formoterol (BREZTRI AEROSPHERE) 160-9-4.8 MCG/ACT AERO Inhale into the  lungs.     Cholecalciferol (VITAMIN D) 125 MCG (5000 UT) CAPS Take 5,000 Units by mouth daily.     [employer] CRANBERRY PO Take by mouth.     ferrous sulfate 325 (65 FE) MG tablet Take 325 mg by mouth daily with breakfast.     finasteride (PROSCAR) 5 MG tablet Take 5 mg by mouth daily.     hydrochlorothiazide (HYDRODIURIL) 12.5 MG tablet Take 12.5 mg by mouth every other day.     MAGNESIUM GLYCINATE PO Take 500 mg by mouth daily.     Multiple Vitamin (MULTIVITAMIN) tablet Take 1 tablet by mouth daily.     OXYGEN Inhale into the lungs. At night     tamsulosin (FLOMAX) 0.4 MG CAPS capsule Take 0.4 mg by mouth in the morning and at bedtime.     trospium (SANCTURA) 20 MG tablet Take 1 tab 1 hour prior to bedtime 30 tablet 0   No current facility-administered medications for this visit.      PHYSICAL EXAMINATION:   Vitals:   02/25/23 1029 02/25/23 1056  BP: (!) 79/47 (!) 102/55  Pulse: 81   Temp: 97.7 F (36.5 C)   SpO2: 97%    Filed Weights   02/25/23 1029  Weight: 154 lb 1.6 oz (69.9 kg)     Physical Exam Constitutional:  Comments: Elderly male patient ambulating independently.  Accompanied by his daughter  HENT:     Head: Normocephalic and atraumatic.     Mouth/Throat:     Pharynx: No oropharyngeal exudate.  Eyes:     Pupils: Pupils are equal, round, and reactive to light.  Cardiovascular:     Rate and Rhythm: Normal rate and regular rhythm.  Pulmonary:     Effort: No respiratory distress.     Breath sounds: No wheezing.     Comments: Decreased air entry bilaterally.   Abdominal:     General: Bowel sounds are normal. There is no distension.     Palpations: Abdomen is soft. There is no mass.     Tenderness: There is no abdominal tenderness. There is no guarding or rebound.     Comments: No obvious splenomegaly noted.  Musculoskeletal:        General: No tenderness. Normal range of motion.     Cervical back: Normal range of motion and neck supple.  Skin:    General:  Skin is warm.  Neurological:     Mental Status: He is alert and oriented to person, place, and time.  Psychiatric:        Mood and Affect: Affect normal.     LABORATORY DATA:  I have reviewed the data as listed Lab Results  Component Value Date   WBC 29.1 (H) 02/25/2023   HGB 12.1 (L) 02/25/2023   HCT 38.4 (L) 02/25/2023   MCV 90.8 02/25/2023   PLT 63 (L) 02/25/2023   Recent Labs    08/27/22 1034 12/01/22 1026 02/25/23 1036  NA 133* 138 137  K 4.2 4.2 4.4  CL 102 105 101  CO2 25 27 29   GLUCOSE 96 90 148*  BUN 36* 31* 25*  CREATININE 1.13 1.19 1.04  CALCIUM 8.8* 8.9 9.2  GFRNONAA >60 58* >60  PROT 6.1* 6.2* 6.8  ALBUMIN 3.5 3.7 3.9  AST 19 22 22   ALT 12 12 13   ALKPHOS 66 68 86  BILITOT 0.6 0.7 0.6     No results found.   [name] (chronic lymphocytic leukemia) (HCC) #[name] [phenotype based on flow cytometry]- PET scan- May 2022-no evidence of any lymphadenopathy. No obvious evidence of progressive disease from underlying [name].   # Patient's white count is about 29,000; Hemoglobin slightly low at 12.3  platelets 60s- mild progression noted- ? Fatigue- recommend PET scan for further evaluation.     #  Mild anemia- Hb-  12- ? Etiology-denies any blood in stools.  GFR 58.  However never had colonoscopy. On Vitron C- iron once a day- await labs from today.   # Prostate cancer: [AUG 2022]-Gleason score:12/12 cores were positive for Gleason 4+5/5+4 adenocarcinoma. [not a candidate for definitive therapy surgery or radiation].  Baseline PSA 23.  On  palliative option of ADT q6M.    Tolerating well except for hot flashes.  MARCH PSA - 0.0.08. Awaiting PSA from today. Stable.   # chronic Hypotension: Stable.   # Hot flashes-grade 1-2 from Eligard-  stable.   # CKD stage II-stable [chronic urinary obstruction-? straight cathing-improved on Eligard]- stable.   # COPD-Active smoker: stable.  Chest x-ray November 2023 no concern for malignancy.  eligard q6M- last 02/25/2023  #  DISPOSITION:  # Eligard today # follow up in 3 months MD; labs- cbc/cmp;PSA;LDH; iron studies;ferretin- PET scan-Dr.B   All questions were answered. The patient knows to call the clinic with any problems, questions or concerns.   Stefano Gaul  Leeanne Deed, MD 02/25/2023 12:47 PM

## 2023-02-26 ENCOUNTER — Ambulatory Visit: Payer: Medicare PPO

## 2023-02-26 ENCOUNTER — Other Ambulatory Visit: Payer: Medicare PPO

## 2023-02-26 ENCOUNTER — Ambulatory Visit: Payer: Medicare PPO | Admitting: Internal Medicine

## 2023-05-11 ENCOUNTER — Encounter: Payer: Self-pay | Admitting: Internal Medicine

## 2023-05-11 ENCOUNTER — Ambulatory Visit: Payer: Medicare PPO

## 2023-05-11 ENCOUNTER — Ambulatory Visit
Admission: EM | Admit: 2023-05-11 | Discharge: 2023-05-11 | Disposition: A | Discharge status: Home / Self Care | Payer: Medicare PPO | Attending: Emergency Medicine | Admitting: Emergency Medicine

## 2023-05-11 DIAGNOSIS — Z20822 Contact with and (suspected) exposure to covid-19: Secondary | ICD-10-CM | POA: Insufficient documentation

## 2023-05-11 DIAGNOSIS — I2510 Atherosclerotic heart disease of native coronary artery without angina pectoris: Secondary | ICD-10-CM | POA: Diagnosis not present

## 2023-05-11 DIAGNOSIS — J189 Pneumonia, unspecified organism: Secondary | ICD-10-CM | POA: Diagnosis not present

## 2023-05-11 DIAGNOSIS — I5032 Chronic diastolic (congestive) heart failure: Secondary | ICD-10-CM | POA: Insufficient documentation

## 2023-05-11 DIAGNOSIS — J449 Chronic obstructive pulmonary disease, unspecified: Secondary | ICD-10-CM | POA: Insufficient documentation

## 2023-05-11 DIAGNOSIS — C9110 Chronic lymphocytic leukemia of B-cell type not having achieved remission: Secondary | ICD-10-CM | POA: Insufficient documentation

## 2023-05-11 DIAGNOSIS — R509 Fever, unspecified: Secondary | ICD-10-CM | POA: Diagnosis present

## 2023-05-11 DIAGNOSIS — I4510 Unspecified right bundle-branch block: Secondary | ICD-10-CM | POA: Diagnosis not present

## 2023-05-11 DIAGNOSIS — F17210 Nicotine dependence, cigarettes, uncomplicated: Secondary | ICD-10-CM | POA: Diagnosis not present

## 2023-05-11 LAB — RESP PANEL BY RT-PCR (RSV, FLU A&B, COVID)  RVPGX2
Influenza A by PCR: NEGATIVE
Influenza B by PCR: NEGATIVE
Resp Syncytial Virus by PCR: NEGATIVE
SARS Coronavirus 2 by RT PCR: NEGATIVE

## 2023-05-11 MED ORDER — BENZONATATE 100 MG PO CAPS: 200.0000 mg | ORAL_CAPSULE | Freq: Three times a day (TID) | ORAL | 0 refills | Status: DC

## 2023-05-11 MED ORDER — AZITHROMYCIN 250 MG PO TABS: 250.0000 mg | ORAL_TABLET | Freq: Every day | ORAL | 0 refills | Status: DC

## 2023-05-11 MED ORDER — AMOXICILLIN-POT CLAVULANATE 875-125 MG PO TABS: 1.0000 | ORAL_TABLET | Freq: Two times a day (BID) | ORAL | 0 refills | Status: AC

## 2023-05-11 MED ORDER — PROMETHAZINE-DM 6.25-15 MG/5ML PO SYRP: 5.0000 mL | ORAL_SOLUTION | Freq: Four times a day (QID) | ORAL | 0 refills | Status: DC | PRN

## 2023-05-11 NOTE — ED Provider Notes (Signed)
[facility]    CSN: [id number] Arrival date & time: 05/11/23  1104      History   Chief Complaint Chief Complaint  Patient presents with   Fever   Congestion   Fatigue    HPI Spencer Buck is a 89 y.o. male.   HPI  89 year old male with a past medical history significant for COPD, diastolic heart failure, [name], cardiomegaly, CAD, BPH, aortic atherosclerosis, RBBB, and nicotine dependence who has been smoking for 79 years presents for evaluation of subjective fever, chills, fatigue, and productive cough for tan sputum x 4 days.  He also endorses shortness of breath but no wheezing.  He is still smoking 1/2 to 1 pack a day.  He denies any URI symptoms.  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)    Nonrheumatic aortic valve disorder    PVD (peripheral vascular disease) (HCC)    Right bundle branch block    Splenomegaly    Urinary retention     Patient Active Problem List   Diagnosis Date Noted   Prostate cancer (HCC) 02/03/2021   Pulmonary emphysema (HCC) 01/09/2021   [name] (chronic lymphocytic leukemia) (HCC) 10/10/2020   Lymphocytosis 10/03/2020   Dyspnea on exertion 10/03/2020   Splenomegaly 10/03/2020   Chronic venous insufficiency 08/25/2020   Lymphedema 08/25/2020   Abnormal ECG 08/05/2020   Left ventricular diastolic dysfunction 08/05/2020   Pedal edema 08/05/2020   Peripheral vascular disease (HCC) 08/05/2020   Premature ventricular contractions 08/05/2020   RBBB (right bundle branch block) 08/05/2020   Tobacco abuse 08/05/2020    Past Surgical History:   Procedure Laterality Date   HERNIA REPAIR Left    KNEE SURGERY Left    torn ligament   PROSTATE BIOPSY N/A 01/28/2021   Procedure: PROSTATE BIOPSY;  Surgeon: Riki Altes, MD;  Location: [facility] ORS;  Service: Urology;  Laterality: N/A;   TRANSRECTAL ULTRASOUND N/A 01/28/2021   Procedure: TRANSRECTAL ULTRASOUND;  Surgeon: Riki Altes, MD;  Location: [facility] OR[facility];  Service: Urology;  Laterality: N/A;  Spoke w/[name] in ultrasound confirmed       Home Medications    Prior to Admission medications   Medication Sig Start Date End Date Taking? Authorizing Provider  albuterol (ACCUNEB) 1.25 MG/3ML nebulizer solution Take 1 ampule by nebulization every 6 (six) hours as needed for wheezing.   Yes [provider]  amoxicillin-clavulanate (AUGMENTIN) 875-125 MG tablet Take 1 tablet by mouth every 12 (twelve) hours for 7 days. 05/11/23 05/18/23 Yes Becky Augusta, NP  aspirin 81 MG EC tablet Chew 81 mg by mouth at bedtime.   Yes [provider]  azithromycin (ZITHROMAX Z-PAK) 250 MG tablet Take 1 tablet (250 mg total) by mouth daily. Take 2 tablets on the first day and then 1 tablet daily thereafter for a total of 5 days of treatment. 05/11/23  Yes Becky Augusta, NP  benzonatate (TESSALON) 100 MG capsule Take 2 capsules (200 mg total) by mouth every 8 (eight) hours. 05/11/23  Yes Becky Augusta, NP  Budeson-Glycopyrrol-Formoterol (BREZTRI AEROSPHERE) 160-9-4.8 MCG/ACT AERO Inhale into the  lungs.   Yes [provider]  Cholecalciferol (VITAMIN D) 125 MCG (5000 UT) CAPS Take 5,000 Units by mouth daily.   Yes [provider]  [employer] CRANBERRY PO Take by mouth.   Yes [provider]  ferrous sulfate 325 (65 FE) MG tablet Take 325 mg by mouth daily with breakfast.   Yes [provider]  finasteride (PROSCAR) 5 MG tablet Take 5 mg by mouth daily. 03/05/20  Yes [provider]  hydrochlorothiazide (HYDRODIURIL) 12.5 MG tablet Take 12.5 mg by mouth every  other day. 02/23/23  Yes [provider]  MAGNESIUM GLYCINATE PO Take 500 mg by mouth daily.   Yes [provider]  Multiple Vitamin (MULTIVITAMIN) tablet Take 1 tablet by mouth daily.   Yes [provider]  oxybutynin (DITROPAN XL) 15 MG 24 hr tablet Take 15 mg by mouth daily.   Yes [provider]  OXYGEN Inhale into the lungs. At night   Yes [provider]  promethazine-dextromethorphan (PROMETHAZINE-DM) 6.25-15 MG/5ML syrup Take 5 mLs by mouth 4 (four) times daily as needed. 05/11/23  Yes Becky Augusta, NP  tamsulosin (FLOMAX) 0.4 MG CAPS capsule Take 0.4 mg by mouth in the morning and at bedtime. 03/05/20  Yes [provider]  trospium (SANCTURA) 20 MG tablet Take 1 tab 1 hour prior to bedtime 09/08/22  Yes Stoioff, Verna Czech, MD    Family History Family History  Problem Relation Age of Onset   Stroke Paternal Grandmother    Glaucoma Other    Diabetes Other     Social History Social History   Tobacco Use   Smoking status: Every Day    Current packs/day: 1.50    Average packs/day: 1.5 packs/day for 72.0 years (108.0 ttl pk-yrs)    Types: Cigarettes   Smokeless tobacco: Never  Vaping Use   Vaping status: Never Used  Substance Use Topics   Alcohol use: Yes    Comment: rarely   Drug use: Never     Allergies   Patient has no known allergies.   Review of Systems Review of Systems  Constitutional:  Positive for chills, fatigue and fever.  HENT:  Negative for congestion, ear pain, rhinorrhea and sore throat.   Respiratory:  Positive for cough and shortness of breath. Negative for wheezing.      Physical Exam Triage Vital Signs ED Triage Vitals  Encounter Vitals Group     BP 05/11/23 1114 (!) 100/56     Systolic BP Percentile --      Diastolic BP Percentile --      Pulse Rate 05/11/23 1114 88     Resp 05/11/23 1114 16     Temp 05/11/23 1114 98.2 F (36.8 C)     Temp Source 05/11/23 1114 Oral     SpO2 05/11/23  1114 91 %     Weight 05/11/23 1113 155 lb (70.3 kg)     Height 05/11/23 1113 6' (1.829 m)     Head Circumference --      Peak Flow --      Pain Score 05/11/23 1119 0     Pain Loc --      Pain Education --      Exclude from Growth Chart --    No data found.  Updated Vital Signs BP (!) 100/56 (BP Location: Left Arm)   Pulse 88   Temp 98.2 F (36.8 C) (Oral)   Resp 16   Ht 6' (1.829 m)   [employer]  155 lb (70.3 kg)   SpO2 91%   BMI 21.02 kg/m   Visual Acuity Right Eye Distance:   Left Eye Distance:   Bilateral Distance:    Right Eye Near:   Left Eye Near:    Bilateral Near:     Physical Exam Vitals and nursing note reviewed.  Constitutional:      Appearance: Normal appearance. He is not ill-appearing.  HENT:     Head: Normocephalic and atraumatic.  Cardiovascular:     Rate and Rhythm: Normal rate and regular rhythm.     Pulses: Normal pulses.     Heart sounds: Normal heart sounds. No murmur heard.    No friction rub. No gallop.  Pulmonary:     Effort: Pulmonary effort is normal.     Comments: Diffusely decreased lung sounds. Skin:    General: Skin is warm and dry.     Capillary Refill: Capillary refill takes less than 2 seconds.     Findings: No rash.  Neurological:     General: No focal deficit present.     Mental Status: He is alert and oriented to person, place, and time.      UC Treatments / Results  Labs (all labs ordered are listed, but only abnormal results are displayed) Labs Reviewed  RESP PANEL BY RT-PCR (RSV, FLU A&B, COVID)  [id number]    EKG   Radiology No results found.  Procedures Procedures (including critical care time)  Medications Ordered in UC Medications - No data to display  Initial Impression / Assessment and Plan / UC Course  I have reviewed the triage vital signs and the nursing notes.  Pertinent labs & imaging results that were available during my care of the patient were reviewed by me and considered in my medical decision  making (see chart for details).   Patient is a pleasant 89 year old male with mild respiratory distress, but able to speak in full sentences, presenting for evaluation of 4 days worth of productive cough with shortness of breath, fatigue, subjective fever, and chills.  His room air oxygen saturation is 91% but his respiratory rate at triage was 16.  He is afebrile with an oral temp of 98.2.  He denies any upper respiratory symptoms though I will order a respiratory panel to evaluate for the presence of COVID, flu, or RSV.  Cardiopulmonary exam reveals decreased lung sounds diffusely.  I will obtain a chest x-ray to evaluate for any acute cardiopulmonary pathology.  Respiratory panel is negative for COVID, influenza, or RSV.  Chest x-ray independently reviewed and evaluated by me.  Pression: Patient has a patchy infiltrate in the right lower lobe as well as the right middle lobe.  Radiology overread is pending.  I will discharge patient home with a diagnosis of community-acquired pneumonia on Augmentin 875 twice daily for 7 days and azithromycin once daily for 5 days.  He should continue to use his albuterol nebulizer treatments every 6 hours as needed for any shortness of breath or wheezing.  I will prescribe Tessalon Perles and Promethazine DM cough syrup the patient can use for cough and congestion.  ER and return precautions reviewed.   Final Clinical Impressions(s) / UC Diagnoses   Final diagnoses:  Community acquired pneumonia of right lung, unspecified part of lung     Discharge Instructions      Take the Augmentin twice daily with food for 7 days for treatment of your pneumonia.  Take the azithromycin as directed.  You will take  2 tablets on day 1 and then 1 tablet each day after that for total of 5 days for treatment of your pneumonia.  Use your albuterol nebulizer every 6 hours as needed for any shortness breath or wheezing.  Use the Tessalon Perles every 8 hours during the day as  needed for cough.  Taken with a small sip of water.  These may give you numbness to the base of your tongue or metallic taste in mouth, this is normal.  Use the Promethazine DM cough syrup at bedtime for cough and congestion as it would make you drowsy.  Return for reevaluation if you have any new or worsening symptoms.  Follow-up with your primary care provider in 4 to 6 weeks for a repeat chest x-ray to ensure resolution of your pneumonia.      ED Prescriptions     Medication Sig Dispense Auth. Provider   amoxicillin-clavulanate (AUGMENTIN) 875-125 MG tablet Take 1 tablet by mouth every 12 (twelve) hours for 7 days. 14 tablet Becky Augusta, NP   azithromycin (ZITHROMAX Z-PAK) 250 MG tablet Take 1 tablet (250 mg total) by mouth daily. Take 2 tablets on the first day and then 1 tablet daily thereafter for a total of 5 days of treatment. 6 tablet Becky Augusta, NP   benzonatate (TESSALON) 100 MG capsule Take 2 capsules (200 mg total) by mouth every 8 (eight) hours. 21 capsule Becky Augusta, NP   promethazine-dextromethorphan (PROMETHAZINE-DM) 6.25-15 MG/5ML syrup Take 5 mLs by mouth 4 (four) times daily as needed. 118 mL Becky Augusta, NP      PDMP not reviewed this encounter.   Becky Augusta, NP 05/11/23 1244

## 2023-05-11 NOTE — ED Triage Notes (Addendum)
Pt c/o fever,congestion,chills & fatigue x4 days. Has tried coricidin w/o relief.

## 2023-05-11 NOTE — Discharge Instructions (Addendum)
Take the Augmentin twice daily with food for 7 days for treatment of your pneumonia.  Take the azithromycin as directed.  You will take 2 tablets on day 1 and then 1 tablet each day after that for total of 5 days for treatment of your pneumonia.  Use your albuterol nebulizer every 6 hours as needed for any shortness breath or wheezing.  Use the Tessalon Perles every 8 hours during the day as needed for cough.  Taken with a small sip of water.  These may give you numbness to the base of your tongue or metallic taste in mouth, this is normal.  Use the Promethazine DM cough syrup at bedtime for cough and congestion as it would make you drowsy.  Return for reevaluation if you have any new or worsening symptoms.  Follow-up with your primary care provider in 4 to 6 weeks for a repeat chest x-ray to ensure resolution of your pneumonia.

## 2023-05-13 ENCOUNTER — Other Ambulatory Visit: Payer: Self-pay

## 2023-05-13 ENCOUNTER — Emergency Department: Payer: Medicare PPO

## 2023-05-13 ENCOUNTER — Emergency Department
Admission: EM | Admit: 2023-05-13 | Discharge: 2023-05-13 | Disposition: A | Discharge status: Home / Self Care | Payer: Medicare PPO | Attending: Emergency Medicine | Admitting: Emergency Medicine

## 2023-05-13 DIAGNOSIS — R0602 Shortness of breath: Secondary | ICD-10-CM | POA: Diagnosis present

## 2023-05-13 DIAGNOSIS — I2510 Atherosclerotic heart disease of native coronary artery without angina pectoris: Secondary | ICD-10-CM | POA: Diagnosis not present

## 2023-05-13 DIAGNOSIS — Z20822 Contact with and (suspected) exposure to covid-19: Secondary | ICD-10-CM | POA: Insufficient documentation

## 2023-05-13 DIAGNOSIS — I509 Heart failure, unspecified: Secondary | ICD-10-CM | POA: Insufficient documentation

## 2023-05-13 DIAGNOSIS — J189 Pneumonia, unspecified organism: Secondary | ICD-10-CM | POA: Insufficient documentation

## 2023-05-13 DIAGNOSIS — R197 Diarrhea, unspecified: Secondary | ICD-10-CM | POA: Insufficient documentation

## 2023-05-13 DIAGNOSIS — Z856 Personal history of leukemia: Secondary | ICD-10-CM | POA: Diagnosis not present

## 2023-05-13 DIAGNOSIS — J449 Chronic obstructive pulmonary disease, unspecified: Secondary | ICD-10-CM | POA: Insufficient documentation

## 2023-05-13 LAB — CBC WITH DIFFERENTIAL/PLATELET
Abs Immature Granulocytes: 0.06 10*3/uL (ref 0.00–0.07)
Basophils Absolute: 0 10*3/uL (ref 0.0–0.1)
Basophils Relative: 0 %
Eosinophils Absolute: 0.1 10*3/uL (ref 0.0–0.5)
Eosinophils Relative: 1 %
HCT: 34.1 % — ABNORMAL LOW (ref 39.0–52.0)
Hemoglobin: 10.9 g/dL — ABNORMAL LOW (ref 13.0–17.0)
Immature Granulocytes: 1 %
Lymphocytes Relative: 54 %
Lymphs Abs: 5.3 10*3/uL — ABNORMAL HIGH (ref 0.7–4.0)
MCH: 29.5 pg (ref 26.0–34.0)
MCHC: 32 g/dL (ref 30.0–36.0)
MCV: 92.2 fL (ref 80.0–100.0)
Monocytes Absolute: 0.4 10*3/uL (ref 0.1–1.0)
Monocytes Relative: 4 %
Neutro Abs: 3.9 10*3/uL (ref 1.7–7.7)
Neutrophils Relative %: 40 %
Platelets: 141 10*3/uL — ABNORMAL LOW (ref 150–400)
RBC: 3.7 MIL/uL — ABNORMAL LOW (ref 4.22–5.81)
RDW: 14.9 % (ref 11.5–15.5)
Smear Review: NORMAL
WBC: 9.7 10*3/uL (ref 4.0–10.5)
nRBC: 0 % (ref 0.0–0.2)

## 2023-05-13 LAB — COMPREHENSIVE METABOLIC PANEL
ALT: 20 U/L (ref 0–44)
AST: 35 U/L (ref 15–41)
Albumin: 2.7 g/dL — ABNORMAL LOW (ref 3.5–5.0)
Alkaline Phosphatase: 59 U/L (ref 38–126)
Anion gap: 10 (ref 5–15)
BUN: 39 mg/dL — ABNORMAL HIGH (ref 8–23)
CO2: 23 mmol/L (ref 22–32)
Calcium: 8.4 mg/dL — ABNORMAL LOW (ref 8.9–10.3)
Chloride: 104 mmol/L (ref 98–111)
Creatinine, Ser: 1.13 mg/dL (ref 0.61–1.24)
GFR, Estimated: >60 mL/min (ref >60)
Glucose, Bld: 106 mg/dL — ABNORMAL HIGH (ref 70–99)
Potassium: 3 mmol/L — ABNORMAL LOW (ref 3.5–5.1)
Sodium: 137 mmol/L (ref 135–145)
Total Bilirubin: 0.3 mg/dL (ref <1.2)
Total Protein: 5.7 g/dL — ABNORMAL LOW (ref 6.5–8.1)

## 2023-05-13 LAB — URINALYSIS, ROUTINE W REFLEX MICROSCOPIC
Bacteria, UA: NONE SEEN
Bilirubin Urine: NEGATIVE
Glucose, UA: NEGATIVE mg/dL
Hgb urine dipstick: NEGATIVE
Ketones, ur: NEGATIVE mg/dL
Leukocytes,Ua: NEGATIVE
Nitrite: NEGATIVE
Protein, ur: 30 mg/dL — AB
Specific Gravity, Urine: 1.023 (ref 1.005–1.030)
pH: 5 (ref 5.0–8.0)

## 2023-05-13 LAB — SARS CORONAVIRUS 2 BY RT PCR: SARS Coronavirus 2 by RT PCR: NEGATIVE

## 2023-05-13 LAB — LIPASE, BLOOD: Lipase: 23 U/L (ref 11–51)

## 2023-05-13 LAB — TROPONIN I (HIGH SENSITIVITY): Troponin I (High Sensitivity): 7 ng/L (ref <18)

## 2023-05-13 MED ORDER — ALBUTEROL SULFATE HFA 108 (90 BASE) MCG/ACT IN AERS
2.0000 | INHALATION_SPRAY | RESPIRATORY_TRACT | Status: DC | PRN
  Administered 2023-05-13: 2 via RESPIRATORY_TRACT
  Filled 2023-05-13: qty 6.7

## 2023-05-13 MED ORDER — IPRATROPIUM-ALBUTEROL 0.5-2.5 (3) MG/3ML IN SOLN: 3.0000 mL | Freq: Once | RESPIRATORY_TRACT | Status: DC

## 2023-05-13 MED ORDER — PREDNISONE 20 MG PO TABS: 20.0000 mg | ORAL_TABLET | Freq: Every day | ORAL | 0 refills | Status: AC

## 2023-05-13 MED ORDER — PREDNISONE 20 MG PO TABS
40.0000 mg | ORAL_TABLET | ORAL | Status: AC
  Administered 2023-05-13: 40 mg via ORAL
  Filled 2023-05-13: qty 2

## 2023-05-13 MED ORDER — ONDANSETRON 4 MG PO TBDP: 4.0000 mg | ORAL_TABLET | Freq: Three times a day (TID) | ORAL | 0 refills | Status: DC | PRN

## 2023-05-13 NOTE — ED Notes (Signed)
Pt's family member came up to nurses desk concerned for patient's breathing. Pt coughing while in room. Pt's O2 saturation up to 95 % on RA. Pt pulled up in bed and repositioned with head of the bed pulled up to a sitting position.

## 2023-05-13 NOTE — ED Notes (Signed)
Daughter now reporting that pt wears 2.5L [place] while sleeping. When she went to check on him tonight, oxygen was off and had been off of pt for unknown amount of time. Pt currently 95% on RA.

## 2023-05-13 NOTE — ED Triage Notes (Signed)
Pt reports he has not been feeling well. Was seen at [facility] and was diagnosed with pneumonia. Pt reports increased SOB, diarrhea, and weakness. Pt reports compliance with prescribed abx. Pt denies chest pain. Pt alert and oriented. Breathing unlabored and speaking in full sentences. Symmetric chest rise and fall. Daughter reports Spo2 of 85% while pt was laying down at home. Pt currently 90-93% in triage.

## 2023-05-13 NOTE — ED Provider Notes (Signed)
[facility] Provider Note    Event Date/Time   First MD Initiated Contact with Patient 05/13/23 [phone]     (approximate)   History   Chief Complaint: Shortness of Breath   HPI  Spencer Buck is a 89 y.o. male with a history of COPD, CAD, [name], heart failure who comes to the ED complaining of weakness, diarrhea, shortness of breath.  Was seen at urgent care 2 days ago, diagnosed with pneumonia, started on Augmentin and azithromycin.  Started taking it yesterday.  Denies chest pain.  Patient and family at bedside note the he normally can ambulate and get around the house without assistance from another person.  Over the last 24 hours he has felt too fatigued and been asking for help getting to the bathroom.  This morning they checked his oxygen saturation at home and found it to be 85% on room air.  He does have oxygen to use as needed at home for sleep.          Physical Exam   Triage Vital Signs: ED Triage Vitals  Encounter Vitals Group     BP 05/13/23 0606 (!) 102/57     Systolic BP Percentile --      Diastolic BP Percentile --      Pulse Rate 05/13/23 0606 72     Resp 05/13/23 0606 20     Temp 05/13/23 0606 98.4 F (36.9 C)     Temp Source 05/13/23 0606 Oral     SpO2 05/13/23 0606 92 %     Weight 05/13/23 0604 150 lb (68 kg)     Height 05/13/23 0604 6' (1.829 m)     Head Circumference --      Peak Flow --      Pain Score 05/13/23 0604 0     Pain Loc --      Pain Education --      Exclude from Growth Chart --     Most recent vital signs: Vitals:   05/13/23 0855 05/13/23 0900  BP:  (!) 109/53  Pulse:  78  Resp:  (!) 27  Temp: 99.2 F (37.3 C)   SpO2:  93%    General: Awake, no distress.  CV:  Good peripheral perfusion.  Regular rate and rhythm Resp:  Normal effort.  Clear to auscultation bilaterally Abd:  No distention.  Soft nontender Other:  Moist oral mucosa   ED Results / Procedures / Treatments   Labs (all labs  ordered are listed, but only abnormal results are displayed) Labs Reviewed  CBC WITH DIFFERENTIAL/PLATELET - Abnormal; Notable for the following components:      Result Value   RBC 3.70 (*)    Hemoglobin 10.9 (*)    HCT 34.1 (*)    Platelets 141 (*)    Lymphs Abs 5.3 (*)    All other components within normal limits  COMPREHENSIVE METABOLIC PANEL - Abnormal; Notable for the following components:   Potassium 3.0 (*)    Glucose, Bld 106 (*)    BUN 39 (*)    Calcium 8.4 (*)    Total Protein 5.7 (*)    Albumin 2.7 (*)    All other components within normal limits  URINALYSIS, ROUTINE W REFLEX MICROSCOPIC - Abnormal; Notable for the following components:   Color, Urine YELLOW (*)    APPearance HAZY (*)    Protein, ur 30 (*)    All other components within normal limits  SARS CORONAVIRUS 2 BY  RT PCR  LIPASE, BLOOD  TROPONIN I (HIGH SENSITIVITY)     EKG Interpreted by me Sinus rhythm rate of 72.  Normal axis intervals QRS ST segments and T waves   RADIOLOGY Chest x-ray interpreted by me, negative for lobar consolidation, edema, or effusion.  Radiology report reviewed noting small areas of patchy pneumonia.   PROCEDURES:  Procedures   MEDICATIONS ORDERED IN ED: Medications  predniSONE (DELTASONE) tablet 40 mg (40 mg Oral Given 05/13/23 0856)     IMPRESSION / MDM / ASSESSMENT AND PLAN / ED COURSE  I reviewed the triage vital signs and the nursing notes.  DDx: Dehydration, AKI, electrolyte abnormality, pneumonia, COVID  Patient's presentation is most consistent with acute presentation with potential threat to life or bodily function.     Clinical Course as of 05/13/23 1447  Thu May 13, 2023  9604 Pt p/w coughing and found to have so2 of 85% at home this morning. Here, so2 is 95% on RA. Has prn o2 and nebs at home. Started augmentin + azithromycin for CAP 2 days ago. Currently NAD, exam reassuring. CTAB without wheezing. Still stable for outpt f/u. Will stop tessalon  and promethazineDM to avoid sedation and allow for sputum clearance. Start low dose pred. Return precautions discussed. [PS]    Clinical Course User Index [PS] Sharman Cheek, MD     FINAL CLINICAL IMPRESSION(S) / ED DIAGNOSES   Final diagnoses:  Community acquired pneumonia, unspecified laterality  Chronic obstructive pulmonary disease, unspecified COPD type (HCC)     Rx / DC Orders   ED Discharge Orders          Ordered    ondansetron (ZOFRAN-ODT) 4 MG disintegrating tablet  Every 8 hours PRN        05/13/23 0823    predniSONE (DELTASONE) 20 MG tablet  Daily with breakfast        05/13/23 5409             Note:  This document was prepared using Dragon voice recognition software and may include unintentional dictation errors.   Sharman Cheek, MD 05/13/23 1447

## 2023-05-13 NOTE — Discharge Instructions (Signed)
Stop promethazine-DM and Tessalon Perles for now.  Start taking guaifenisen (Mucinex) twice daily to help with chest congestion.

## 2023-05-25 ENCOUNTER — Ambulatory Visit
Admission: RE | Admit: 2023-05-25 | Discharge: 2023-05-25 | Disposition: A | Discharge status: Home / Self Care | Payer: Medicare PPO | Source: Ambulatory Visit | Attending: Internal Medicine | Admitting: Internal Medicine

## 2023-05-25 DIAGNOSIS — R161 Splenomegaly, not elsewhere classified: Secondary | ICD-10-CM | POA: Diagnosis not present

## 2023-05-25 DIAGNOSIS — J189 Pneumonia, unspecified organism: Secondary | ICD-10-CM | POA: Insufficient documentation

## 2023-05-25 DIAGNOSIS — F17200 Nicotine dependence, unspecified, uncomplicated: Secondary | ICD-10-CM | POA: Insufficient documentation

## 2023-05-25 DIAGNOSIS — C9110 Chronic lymphocytic leukemia of B-cell type not having achieved remission: Secondary | ICD-10-CM | POA: Diagnosis present

## 2023-05-25 DIAGNOSIS — J440 Chronic obstructive pulmonary disease with acute lower respiratory infection: Secondary | ICD-10-CM | POA: Insufficient documentation

## 2023-05-25 LAB — GLUCOSE, CAPILLARY: Glucose-Capillary: 88 mg/dL (ref 70–99)

## 2023-05-25 MED ORDER — FLUDEOXYGLUCOSE F - 18 (FDG) INJECTION
7.8000 | Freq: Once | INTRAVENOUS | Status: AC | PRN
  Administered 2023-05-25: 8.5 via INTRAVENOUS

## 2023-06-03 ENCOUNTER — Inpatient Hospital Stay (HOSPITAL_BASED_OUTPATIENT_CLINIC_OR_DEPARTMENT_OTHER): Payer: Medicare PPO | Admitting: Internal Medicine

## 2023-06-03 ENCOUNTER — Encounter: Payer: Self-pay | Admitting: Internal Medicine

## 2023-06-03 ENCOUNTER — Inpatient Hospital Stay: Payer: Medicare PPO | Attending: Internal Medicine

## 2023-06-03 ENCOUNTER — Inpatient Hospital Stay: Payer: Medicare PPO

## 2023-06-03 DIAGNOSIS — C9110 Chronic lymphocytic leukemia of B-cell type not having achieved remission: Secondary | ICD-10-CM | POA: Insufficient documentation

## 2023-06-03 DIAGNOSIS — Z79899 Other long term (current) drug therapy: Secondary | ICD-10-CM | POA: Insufficient documentation

## 2023-06-03 DIAGNOSIS — Z7982 Long term (current) use of aspirin: Secondary | ICD-10-CM | POA: Insufficient documentation

## 2023-06-03 DIAGNOSIS — C61 Malignant neoplasm of prostate: Secondary | ICD-10-CM | POA: Diagnosis present

## 2023-06-03 DIAGNOSIS — F17210 Nicotine dependence, cigarettes, uncomplicated: Secondary | ICD-10-CM | POA: Diagnosis not present

## 2023-06-03 LAB — CBC WITH DIFFERENTIAL (CANCER CENTER ONLY)
Abs Immature Granulocytes: 0.02 10*3/uL (ref 0.00–0.07)
Basophils Absolute: 0 10*3/uL (ref 0.0–0.1)
Basophils Relative: 0 %
Eosinophils Absolute: 0 10*3/uL (ref 0.0–0.5)
Eosinophils Relative: 1 %
HCT: 33 % — ABNORMAL LOW (ref 39.0–52.0)
Hemoglobin: 10 g/dL — ABNORMAL LOW (ref 13.0–17.0)
Immature Granulocytes: 0 %
Lymphocytes Relative: 74 %
Lymphs Abs: 3.9 10*3/uL (ref 0.7–4.0)
MCH: 29.2 pg (ref 26.0–34.0)
MCHC: 30.3 g/dL (ref 30.0–36.0)
MCV: 96.2 fL (ref 80.0–100.0)
Monocytes Absolute: 0.3 10*3/uL (ref 0.1–1.0)
Monocytes Relative: 6 %
Neutro Abs: 1 10*3/uL — ABNORMAL LOW (ref 1.7–7.7)
Neutrophils Relative %: 19 %
Platelet Count: 50 10*3/uL — ABNORMAL LOW (ref 150–400)
RBC: 3.43 MIL/uL — ABNORMAL LOW (ref 4.22–5.81)
RDW: 15.6 % — ABNORMAL HIGH (ref 11.5–15.5)
WBC Count: 5.2 10*3/uL (ref 4.0–10.5)
nRBC: 0 % (ref 0.0–0.2)

## 2023-06-03 LAB — CMP (CANCER CENTER ONLY)
ALT: 17 U/L (ref 0–44)
AST: 19 U/L (ref 15–41)
Albumin: 3.2 g/dL — ABNORMAL LOW (ref 3.5–5.0)
Alkaline Phosphatase: 56 U/L (ref 38–126)
Anion gap: 12 (ref 5–15)
BUN: 28 mg/dL — ABNORMAL HIGH (ref 8–23)
CO2: 28 mmol/L (ref 22–32)
Calcium: 8.9 mg/dL (ref 8.9–10.3)
Chloride: 101 mmol/L (ref 98–111)
Creatinine: 1.01 mg/dL (ref 0.61–1.24)
GFR, Estimated: >60 mL/min (ref >60)
Glucose, Bld: 99 mg/dL (ref 70–99)
Potassium: 4 mmol/L (ref 3.5–5.1)
Sodium: 141 mmol/L (ref 135–145)
Total Bilirubin: 0.6 mg/dL (ref <1.2)
Total Protein: 5.8 g/dL — ABNORMAL LOW (ref 6.5–8.1)

## 2023-06-03 LAB — IRON AND TIBC
Iron: 53 ug/dL (ref 45–182)
Saturation Ratios: 17 % — ABNORMAL LOW (ref 17.9–39.5)
TIBC: 309 ug/dL (ref 250–450)
UIBC: 256 ug/dL

## 2023-06-03 LAB — FERRITIN: Ferritin: 34 ng/mL (ref 24–336)

## 2023-06-03 LAB — PSA: Prostatic Specific Antigen: 0.06 ng/mL (ref 0.00–4.00)

## 2023-06-03 LAB — LACTATE DEHYDROGENASE: LDH: 111 U/L (ref 98–192)

## 2023-06-03 NOTE — Progress Notes (Signed)
[facility] CONSULT NOTE  Patient Care Team: Armando Gang, FNP as PCP - General (Family Medicine) Earna Coder, MD as Consulting Physician (Oncology)  CHIEF COMPLAINTS/PURPOSE OF CONSULTATION: [name]/prostate cancer  Oncology History Overview Note  #April-MAY 2022-[name] [phenotype based on flow cytometry]; absolute neutrophil count 7000; white count 12,000 hemoglobin 13; platelets 120s. PET scan- May 2022-no evidence of any lymphadenopathy-suggestive of active [name].Marland Kitchen  Borderline enlarged spleen/normal metabolic activity; see discussion regarding left kidney [see below].   # AUG 2022-prostate adenocarcinoma Gleason score 4+5 [12 out of 12 cores]; Dr.Stoioff- SEP 12th, 2022-Eligard every 6 months  # COPD/active smoker; CAD; hx of urinary obstruction [Dr.Stoioff]; ? Left kidney UPJ obstruction ----------------------------------  HEMATOLOGY HISTORY:  # April 2022- [name] [phenotype flow cytometry]; US-splenomegaly mild.  Absolute neutrophil count 7000; hemoglobin 13; platelets 120s.   # COPD at night O2; active smoker [1-1.5ppd]; CAD/ right bundle branch block, and peripheral vascular disease [Dr.Parashoes]   [name] (chronic lymphocytic leukemia) (HCC)  10/10/2020 Initial Diagnosis   [name] (chronic lymphocytic leukemia) (HCC)   10/22/2020 Cancer Staging   Staging form: Chronic Lymphocytic Leukemia / Small Lymphocytic Lymphoma, AJCC 8th Edition - Clinical: Modified Rai Stage II (Modified Rai risk: Intermediate, Lymphocytosis: Present, Adenopathy: Absent, Organomegaly: Present, Anemia: Absent, Thrombocytopenia: Absent) - Signed by Earna Coder, MD on 10/22/2020 Stage prefix: Initial diagnosis     HISTORY OF PRESENTING ILLNESS: Elderly male patient.  Accompanied by his daughter  Dailin Regimbal 89 y.o.  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  In the interim patient was  treated for pneumonia. Was seen at [facility] 05/13/23.  Currently status post antibiotics/steroids.   Had something fall and hit his left hand this morning, bruised.  Otherwise denies any ongoing bleeding.  Uses O2 at bedtime. Unfortunately continues to smoke.  Daughter is concerned with worsening energy.  No significant hot flashes.   Review of Systems  Constitutional:  Positive for malaise/fatigue and weight loss. Negative for chills, diaphoresis and fever.  HENT:  Negative for nosebleeds and sore throat.   Eyes:  Negative for double vision.  Respiratory:  Negative for cough, hemoptysis, sputum production, shortness of breath and wheezing.   Cardiovascular:  Negative for chest pain, palpitations, orthopnea and leg swelling.  Gastrointestinal:  Negative for abdominal pain, blood in stool, constipation, diarrhea, heartburn, melena, nausea and vomiting.  Genitourinary:  Negative for dysuria, frequency and urgency.  Musculoskeletal:  Positive for back pain and joint pain.  Skin: Negative.  Negative for itching and rash.  Neurological:  Negative for dizziness, tingling, focal weakness, weakness and headaches.  Endo/Heme/Allergies:  Does not bruise/bleed easily.  Psychiatric/Behavioral:  Negative for depression. The patient is not nervous/anxious and does not have insomnia.     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)    Nonrheumatic aortic valve disorder    PVD (peripheral vascular disease) (HCC)    Right bundle branch block    Splenomegaly  Urinary retention     SURGICAL HISTORY: Past Surgical History:  Procedure Laterality Date   HERNIA REPAIR Left    KNEE SURGERY Left    torn ligament   PROSTATE BIOPSY N/A 01/28/2021   Procedure: PROSTATE BIOPSY;  Surgeon: Riki Altes, MD;  Location: [facility] ORS;  Service: Urology;  Laterality: N/A;   TRANSRECTAL ULTRASOUND N/A 01/28/2021   Procedure: TRANSRECTAL ULTRASOUND;  Surgeon: Riki Altes, MD;  Location: [facility] ORS;  Service: Urology;  Laterality: N/A;  Spoke w/[name] in ultrasound confirmed    SOCIAL HISTORY: Social History   Socioeconomic History   Marital status: Married    Spouse name: Not on file   Number of children: Not on file   Years of education: Not on file   Highest education level: Not on file  Occupational History   Not on file  Tobacco Use   Smoking status: Every Day    Current packs/day: 1.50    Average packs/day: 1.5 packs/day for 72.0 years (108.0 ttl pk-yrs)    Types: Cigarettes   Smokeless tobacco: Never  Vaping Use   Vaping status: Never Used  Substance and Sexual Activity   Alcohol use: Yes    Comment: rarely   Drug use: Never   Sexual activity: Yes  Other Topics Concern   Not on file  Social History Narrative   Not on file   Social Drivers of Health   Financial Resource Strain: Not on file  Food Insecurity: Not on file  Transportation Needs: Not on file  Physical Activity: Not on file  Stress: Not on file  Social Connections: Not on file  Intimate Partner Violence: Not on file    FAMILY HISTORY: Family History  Problem Relation Age of Onset   Stroke Paternal Grandmother    Glaucoma Other    Diabetes Other     ALLERGIES:  has no known allergies.  MEDICATIONS:  Current Outpatient Medications  Medication Sig Dispense Refill   albuterol (ACCUNEB) 1.25 MG/3ML nebulizer solution Take 1 ampule by nebulization every 6 (six) hours as needed for wheezing.     aspirin 81 MG EC tablet Chew 81 mg by mouth at bedtime.      Budeson-Glycopyrrol-Formoterol (BREZTRI AEROSPHERE) 160-9-4.8 MCG/ACT AERO Inhale into the lungs.     Cholecalciferol (VITAMIN D) 125 MCG (5000 UT) CAPS Take 5,000 Units by mouth daily.     doxycycline (VIBRA-TABS) 100 MG tablet Take 100 mg by mouth 2 (two) times daily.     ferrous sulfate 325 (65 FE) MG tablet Take 325 mg by mouth daily with breakfast.     finasteride (PROSCAR) 5 MG tablet Take 5 mg by mouth daily.     hydrochlorothiazide (HYDRODIURIL) 12.5 MG tablet Take 12.5 mg by mouth every other day.     MAGNESIUM GLYCINATE PO Take 500 mg by mouth daily.     ondansetron (ZOFRAN-ODT) 4 MG disintegrating tablet Take 1 tablet (4 mg total) by mouth every 8 (eight) hours as needed for nausea or vomiting. 20 tablet 0   OXYGEN Inhale into the lungs. At night     tamsulosin (FLOMAX) 0.4 MG CAPS capsule Take 0.4 mg by mouth in the morning and at bedtime.     No current facility-administered medications for this visit.      PHYSICAL EXAMINATION:   Vitals:   06/03/23 1017  BP: 126/67  Pulse: 78  Temp: 97.9 F (36.6 C)  SpO2: 99%   Filed Weights   06/03/23 1017  Weight: 177  lb (80.3 kg)     Physical Exam Constitutional:      Comments: Elderly male patient ambulating independently.  Accompanied by his daughter  HENT:     Head: Normocephalic and atraumatic.     Mouth/Throat:     Pharynx: No oropharyngeal exudate.  Eyes:     Pupils: Pupils are equal, round, and reactive to light.  Cardiovascular:     Rate and Rhythm: Normal rate and regular rhythm.  Pulmonary:     Effort: No respiratory distress.     Breath sounds: No wheezing.     Comments: Decreased air entry bilaterally.   Abdominal:     General: Bowel sounds are normal. There is no distension.     Palpations: Abdomen is soft. There is no mass.     Tenderness: There is no abdominal tenderness. There is no guarding or rebound.     Comments: No obvious splenomegaly noted.  Musculoskeletal:        General: No tenderness.  Normal range of motion.     Cervical back: Normal range of motion and neck supple.  Skin:    General: Skin is warm.  Neurological:     Mental Status: He is alert and oriented to person, place, and time.  Psychiatric:        Mood and Affect: Affect normal.     LABORATORY DATA:  I have reviewed the data as listed Lab Results  Component Value Date   WBC 5.2 06/03/2023   HGB 10.0 (L) 06/03/2023   HCT 33.0 (L) 06/03/2023   MCV 96.2 06/03/2023   PLT 50 (L) 06/03/2023   Recent Labs    02/25/23 1036 05/13/23 0615 06/03/23 1006  NA 137 137 141  K 4.4 3.0* 4.0  CL 101 104 101  CO2 29 23 28   GLUCOSE 148* 106* 99  BUN 25* 39* 28*  CREATININE 1.04 1.13 1.01  CALCIUM 9.2 8.4* 8.9  GFRNONAA >60 >60 >60  PROT 6.8 5.7* 5.8*  ALBUMIN 3.9 2.7* 3.2*  AST 22 35 19  ALT 13 20 17   ALKPHOS 86 59 56  BILITOT 0.6 0.3 0.6     NM PET Image Restage (PS) Skull Base to Thigh (F-18 FDG) Result Date: 06/02/2023 CLINICAL DATA:  Subsequent treatment strategy for chronic lymphocytic leukemia. EXAM: NUCLEAR MEDICINE PET SKULL BASE TO THIGH TECHNIQUE: 8.5 mCi F-18 FDG was injected intravenously. Full-ring PET imaging was performed from the skull base to thigh after the radiotracer. CT data was obtained and used for attenuation correction and anatomic localization. Fasting blood glucose: 88 mg/dl COMPARISON:  PET-CT scan 10/17/2020, nuclear medicine renal scan 01/04/2021 FINDINGS: NECK: No hypermetabolic lymph nodes in the neck. Incidental CT findings: None. CHEST: No hypermetabolic or enlarged mediastinal lymph nodes. Within the posterior RIGHT upper lobe rounded focus of consolidation measures 2.2 cm (image 50/6) has mild metabolic activity with SUV max equal 2.8. Legrand Rams a focus of pulmonary infection. Focus of metabolic activity in the periphery of the RIGHT lower lobe with SUV max equal 4.1 on image 72. No clear CT correlation. There is peripheral interstitial thickening. Legrand Rams is also represent small  focus of pulmonary infection. Small RIGHT pleural effusion. Incidental CT findings: None. ABDOMEN/PELVIS: Spleen is mildly enlarged similar prior measuring 14.7 by 6.0 cm in axial dimension compared to 14.0 x 5.8 cm. Spleen has normal metabolic activity. No enlarged or hypermetabolic retroperitoneal nodes. Mildly enlarged LEFT external iliac node measuring 14 mm (image 136/6) and is unchanged from prior and without metabolic activity.  Incidental CT findings: Again demonstrated nonobstructive hydronephrosis of the scratch the again demonstrated retained radiotracer within the LEFT renal pelvis. This demonstrated be nonobstructive hydronephrosis comparison renal scan. SKELETON: No focal hypermetabolic activity to suggest skeletal metastasis. Incidental CT findings: None. IMPRESSION: 1. No evidence of lymphoma recurrence. 2. Two foci of metabolic activity in the RIGHT lung with benign appearing parenchymal findings are favor foci pulmonary infection. 3. Small RIGHT effusion. 4. Mild splenomegaly not changed from prior. 5. Nonobstructive hydronephrosis of LEFT kidney unchanged. These results will be called to the ordering clinician or representative by the Radiologist Assistant, and communication documented in the PACS or [employer]. Electronically Signed   By: Genevive Bi M.D.   On: 06/02/2023 16:26   DG Chest Port 1 View Result Date: 05/13/2023 CLINICAL DATA:  Shortness of breath EXAM: PORTABLE CHEST 1 VIEW COMPARISON:  Two days ago FINDINGS: Patchy infiltrate in the bilateral lungs is similar to prior. There is likely chronic lung disease with emphysema by 2022 comparison. Borderline heart size. Unremarkable mediastinal contours. Thoracolumbar scoliosis. IMPRESSION: Similar degree of patchy bilateral pneumonia. Electronically Signed   By: Tiburcio Pea M.D.   On: 05/13/2023 06:59   DG Chest 2 View Result Date: 05/11/2023 CLINICAL DATA:  Fever, cough and chills. EXAM: CHEST - 2 VIEW COMPARISON:   X-ray 04/21/2022 FINDINGS: Hyperinflation. No pneumothorax or effusion. Underlying interstitial changes again seen but there is new patchy parenchymal opacities identified such as inferior right upper lobe and right lung base. Minimal at the left lung base. Acute infiltrates are possible such as pneumonia. Recommend short follow-up to confirm clearance. Normal cardiopericardial silhouette with calcified aorta. Curvature of the spine with degenerative changes. IMPRESSION: Developing parenchymal ill-defined opacities such as inferior right upper lobe and right lung base greater than left lung base. Multifocal infiltrates possible. Recommend short follow-up to confirm resolution Electronically Signed   By: Karen Kays M.D.   On: 05/11/2023 13:53     [name] (chronic lymphocytic leukemia) (HCC) #2022- [name] [phenotype based on flow cytometry]- 05/25/2023- PET scan:  No evidence of lymphoma recurrence; Mild splenomegaly not changed from prior. No obvious evidence of progressive disease from underlying [name].   # Recent white count 29,000-however today white count is normal [likely secondary steroids]; mild anemia mild to moderate thrombocytopenia platelets 50s.  Unclear if this is secondary progression of [name] versus recent infection.  I would recommend monitoring labs more frequently in 2 months.  # Recent Pneumonia: PET scan: Two foci of metabolic activity in the RIGHT lung with benign appearing parenchymal findings are favor foci pulmonary infection-status post antibiotics and prednisone.  Currently improved.  #  Mild anemia- Hb-  10-  ? Etiology-denies any blood in stools.  GFR 58.  However never had colonoscopy. On Vitron C- iron once a day-iron saturation 17%.  Ferritin 34.  Continue oral iron for now.  # Prostate cancer: [AUG 2022]-Gleason score:12/12 cores were positive for Gleason 4+5/5+4 adenocarcinoma. [not a candidate for definitive therapy surgery or radiation].  Baseline PSA 23.  On  palliative option  of ADT q6M.    Tolerating well except for hot flashes.  MARCH PSA - 0.0.08. Awaiting PSA from today. Stable.   # chronic Hypotension: Stable.   # Hot flashes-grade 1-2 from Eligard-  stable.   # CKD stage II-stable [chronic urinary obstruction-? straight cathing-improved on Eligard]- stable.   # COPD-Active smoker: stable.  Chest x-ray November 2023 no concern for malignancy.  eligard q6M- last 02/25/2023; eligard due in march 2025.  #  DISPOSITION:  # follow up in 8 weeks- MD; labs- cbc/cmp; PSA; LDH-Dr.B  # I reviewed the blood work- with the patient in detail; also reviewed the imaging independently [as summarized above]; and with the patient in detail.  '    All questions were answered. The patient knows to call the clinic with any problems, questions or concerns.   Earna Coder, MD 06/03/2023 2:01 PM

## 2023-06-03 NOTE — Progress Notes (Signed)
Being treated for pneumonia. Was seen at [facility] 05/13/23.  Had something fall and hit his left hand this morning, bruised.

## 2023-06-03 NOTE — Assessment & Plan Note (Addendum)
#  2022- [name] [phenotype based on flow cytometry]- 05/25/2023- PET scan:  No evidence of lymphoma recurrence; Mild splenomegaly not changed from prior. No obvious evidence of progressive disease from underlying [name].   # Recent white count 29,000-however today white count is normal [likely secondary steroids]; mild anemia mild to moderate thrombocytopenia platelets 50s.  Unclear if this is secondary progression of [name] versus recent infection.  I would recommend monitoring labs more frequently in 2 months.  # Recent Pneumonia: PET scan: Two foci of metabolic activity in the RIGHT lung with benign appearing parenchymal findings are favor foci pulmonary infection-status post antibiotics and prednisone.  Currently improved.  #  Mild anemia- Hb-  10-  ? Etiology-denies any blood in stools.  GFR 58.  However never had colonoscopy. On Vitron C- iron once a day-iron saturation 17%.  Ferritin 34.  Continue oral iron for now.  # Prostate cancer: [AUG 2022]-Gleason score:12/12 cores were positive for Gleason 4+5/5+4 adenocarcinoma. [not a candidate for definitive therapy surgery or radiation].  Baseline PSA 23.  On  palliative option of ADT q6M.    Tolerating well except for hot flashes.  MARCH PSA - 0.0.08. Awaiting PSA from today. Stable.   # chronic Hypotension: Stable.   # Hot flashes-grade 1-2 from Eligard-  stable.   # CKD stage II-stable [chronic urinary obstruction-? straight cathing-improved on Eligard]- stable.   # COPD-Active smoker: stable.  Chest x-ray November 2023 no concern for malignancy.  eligard q6M- last 02/25/2023; eligard due in march 2025.  # DISPOSITION:  # follow up in 8 weeks- MD; labs- cbc/cmp; PSA; LDH-Dr.B  # I reviewed the blood work- with the patient in detail; also reviewed the imaging independently [as summarized above]; and with the patient in detail.  '

## 2023-07-29 ENCOUNTER — Inpatient Hospital Stay (HOSPITAL_BASED_OUTPATIENT_CLINIC_OR_DEPARTMENT_OTHER): Payer: Medicare PPO | Admitting: Internal Medicine

## 2023-07-29 ENCOUNTER — Encounter: Payer: Self-pay | Admitting: Internal Medicine

## 2023-07-29 ENCOUNTER — Inpatient Hospital Stay: Payer: Medicare PPO | Attending: Internal Medicine

## 2023-07-29 VITALS — BP 102/55 | HR 74 | Resp 18 | Wt 157.0 lb

## 2023-07-29 DIAGNOSIS — F17210 Nicotine dependence, cigarettes, uncomplicated: Secondary | ICD-10-CM | POA: Insufficient documentation

## 2023-07-29 DIAGNOSIS — D631 Anemia in chronic kidney disease: Secondary | ICD-10-CM | POA: Diagnosis not present

## 2023-07-29 DIAGNOSIS — C61 Malignant neoplasm of prostate: Secondary | ICD-10-CM | POA: Diagnosis present

## 2023-07-29 DIAGNOSIS — C9110 Chronic lymphocytic leukemia of B-cell type not having achieved remission: Secondary | ICD-10-CM

## 2023-07-29 DIAGNOSIS — N182 Chronic kidney disease, stage 2 (mild): Secondary | ICD-10-CM | POA: Insufficient documentation

## 2023-07-29 LAB — CMP (CANCER CENTER ONLY)
ALT: 10 U/L (ref 0–44)
AST: 19 U/L (ref 15–41)
Albumin: 3.9 g/dL (ref 3.5–5.0)
Alkaline Phosphatase: 79 U/L (ref 38–126)
Anion gap: 10 (ref 5–15)
BUN: 27 mg/dL — ABNORMAL HIGH (ref 8–23)
CO2: 27 mmol/L (ref 22–32)
Calcium: 9.3 mg/dL (ref 8.9–10.3)
Chloride: 101 mmol/L (ref 98–111)
Creatinine: 1.02 mg/dL (ref 0.61–1.24)
GFR, Estimated: >60 mL/min (ref >60)
Glucose, Bld: 105 mg/dL — ABNORMAL HIGH (ref 70–99)
Potassium: 4 mmol/L (ref 3.5–5.1)
Sodium: 138 mmol/L (ref 135–145)
Total Bilirubin: 0.6 mg/dL (ref 0.0–1.2)
Total Protein: 6.4 g/dL — ABNORMAL LOW (ref 6.5–8.1)

## 2023-07-29 LAB — CBC WITH DIFFERENTIAL (CANCER CENTER ONLY)
Abs Immature Granulocytes: 0.08 10*3/uL — ABNORMAL HIGH (ref 0.00–0.07)
Basophils Absolute: 0.1 10*3/uL (ref 0.0–0.1)
Basophils Relative: 0 %
Eosinophils Absolute: 0.3 10*3/uL (ref 0.0–0.5)
Eosinophils Relative: 1 %
HCT: 37.8 % — ABNORMAL LOW (ref 39.0–52.0)
Hemoglobin: 12.2 g/dL — ABNORMAL LOW (ref 13.0–17.0)
Immature Granulocytes: 0 %
Lymphocytes Relative: 76 %
Lymphs Abs: 22.9 10*3/uL — ABNORMAL HIGH (ref 0.7–4.0)
MCH: 28.6 pg (ref 26.0–34.0)
MCHC: 32.3 g/dL (ref 30.0–36.0)
MCV: 88.7 fL (ref 80.0–100.0)
Monocytes Absolute: 3.6 10*3/uL — ABNORMAL HIGH (ref 0.1–1.0)
Monocytes Relative: 12 %
Neutro Abs: 3.5 10*3/uL (ref 1.7–7.7)
Neutrophils Relative %: 11 %
Platelet Count: 80 10*3/uL — ABNORMAL LOW (ref 150–400)
RBC: 4.26 MIL/uL (ref 4.22–5.81)
RDW: 14 % (ref 11.5–15.5)
Smear Review: NORMAL
WBC Count: 30.4 10*3/uL — ABNORMAL HIGH (ref 4.0–10.5)
nRBC: 0 % (ref 0.0–0.2)

## 2023-07-29 LAB — LACTATE DEHYDROGENASE: LDH: 131 U/L (ref 98–192)

## 2023-07-29 NOTE — Progress Notes (Signed)
[facility] CONSULT NOTE  Patient Care Team: Armando Gang, FNP as PCP - General (Family Medicine) Earna Coder, MD as Consulting Physician (Oncology)  CHIEF COMPLAINTS/PURPOSE OF CONSULTATION: [name]/prostate cancer  Oncology History Overview Note  #April-MAY 2022-[name] [phenotype based on flow cytometry]; absolute neutrophil count 7000; white count 12,000 hemoglobin 13; platelets 120s. PET scan- May 2022-no evidence of any lymphadenopathy-suggestive of active [name].Marland Kitchen  Borderline enlarged spleen/normal metabolic activity; see discussion regarding left kidney [see below].   # AUG 2022-prostate adenocarcinoma Gleason score 4+5 [12 out of 12 cores]; Dr.Stoioff- SEP 12th, 2022-Eligard every 6 months  # COPD/active smoker; CAD; hx of urinary obstruction [Dr.Stoioff]; ? Left kidney UPJ obstruction ----------------------------------  HEMATOLOGY HISTORY:  # April 2022- [name] [phenotype flow cytometry]; US-splenomegaly mild.  Absolute neutrophil count 7000; hemoglobin 13; platelets 120s.   # COPD at night O2; active smoker [1-1.5ppd]; CAD/ right bundle branch block, and peripheral vascular disease [Dr.Parashoes]   [name] (chronic lymphocytic leukemia) (HCC)  10/10/2020 Initial Diagnosis   [name] (chronic lymphocytic leukemia) (HCC)   10/22/2020 Cancer Staging   Staging form: Chronic Lymphocytic Leukemia / Small Lymphocytic Lymphoma, AJCC 8th Edition - Clinical: Modified Rai Stage II (Modified Rai risk: Intermediate, Lymphocytosis: Present, Adenopathy: Absent, Organomegaly: Present, Anemia: Absent, Thrombocytopenia: Absent) - Signed by Earna Coder, MD on 10/22/2020 Stage prefix: Initial diagnosis     HISTORY OF PRESENTING ILLNESS: Elderly male patient.  Accompanied by his daughter  Spencer Buck 89 y.o.  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  Pt's weight is down. He was carrying a lot of fluid  which is now gone. Appetite is good. Energy is so so. Has dyspnea with exertion; continues to smoke. Ambulating well. Denies pain.  Uses O2 at bedtime may be twice a week. Unfortunately continues to smoke.  No significant hot flashes.   Review of Systems  Constitutional:  Positive for malaise/fatigue and weight loss. Negative for chills, diaphoresis and fever.  HENT:  Negative for nosebleeds and sore throat.   Eyes:  Negative for double vision.  Respiratory:  Negative for cough, hemoptysis, sputum production, shortness of breath and wheezing.   Cardiovascular:  Negative for chest pain, palpitations, orthopnea and leg swelling.  Gastrointestinal:  Negative for abdominal pain, blood in stool, constipation, diarrhea, heartburn, melena, nausea and vomiting.  Genitourinary:  Negative for dysuria, frequency and urgency.  Musculoskeletal:  Positive for back pain and joint pain.  Skin: Negative.  Negative for itching and rash.  Neurological:  Negative for dizziness, tingling, focal weakness, weakness and headaches.  Endo/Heme/Allergies:  Does not bruise/bleed easily.  Psychiatric/Behavioral:  Negative for depression. The patient is not nervous/anxious and does not have insomnia.     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)    Nonrheumatic aortic valve disorder    PVD (peripheral vascular disease) (HCC)    Right bundle branch block    Splenomegaly    Urinary retention     SURGICAL HISTORY: Past Surgical History:  Procedure Laterality Date  HERNIA REPAIR Left    KNEE SURGERY Left     torn ligament   PROSTATE BIOPSY N/A 01/28/2021   Procedure: PROSTATE BIOPSY;  Surgeon: Riki Altes, MD;  Location: [facility] ORS;  Service: Urology;  Laterality: N/A;   TRANSRECTAL ULTRASOUND N/A 01/28/2021   Procedure: TRANSRECTAL ULTRASOUND;  Surgeon: Riki Altes, MD;  Location: [facility] ORS;  Service: Urology;  Laterality: N/A;  Spoke w/[name] in ultrasound confirmed    SOCIAL HISTORY: Social History   Socioeconomic History   Marital status: Married    Spouse name: Not on file   Number of children: Not on file   Years of education: Not on file   Highest education level: Not on file  Occupational History   Not on file  Tobacco Use   Smoking status: Every Day    Current packs/day: 1.50    Average packs/day: 1.5 packs/day for 72.0 years (108.0 ttl pk-yrs)    Types: Cigarettes   Smokeless tobacco: Never  Vaping Use   Vaping status: Never Used  Substance and Sexual Activity   Alcohol use: Yes    Comment: rarely   Drug use: Never   Sexual activity: Yes  Other Topics Concern   Not on file  Social History Narrative   Not on file   Social Drivers of Health   Financial Resource Strain: Not on file  Food Insecurity: Not on file  Transportation Needs: Not on file  Physical Activity: Not on file  Stress: Not on file  Social Connections: Not on file  Intimate Partner Violence: Not on file    FAMILY HISTORY: Family History  Problem Relation Age of Onset   Stroke Paternal Grandmother    Glaucoma Other    Diabetes Other     ALLERGIES:  has no known allergies.  MEDICATIONS:  Current Outpatient Medications  Medication Sig Dispense Refill   albuterol (ACCUNEB) 1.25 MG/3ML nebulizer solution Take 1 ampule by nebulization every 6 (six) hours as needed for wheezing.     aspirin 81 MG EC tablet Chew 81 mg by mouth at bedtime.     Budeson-Glycopyrrol-Formoterol (BREZTRI AEROSPHERE) 160-9-4.8 MCG/ACT AERO Inhale into the lungs.     Cholecalciferol (VITAMIN D) 125 MCG (5000 UT)  CAPS Take 5,000 Units by mouth daily.     doxycycline (VIBRA-TABS) 100 MG tablet Take 100 mg by mouth 2 (two) times daily.     ferrous sulfate 325 (65 FE) MG tablet Take 325 mg by mouth daily with breakfast.     finasteride (PROSCAR) 5 MG tablet Take 5 mg by mouth daily.     hydrochlorothiazide (HYDRODIURIL) 12.5 MG tablet Take 12.5 mg by mouth every other day.     MAGNESIUM GLYCINATE PO Take 500 mg by mouth daily.     OXYGEN Inhale into the lungs. At night     tamsulosin (FLOMAX) 0.4 MG CAPS capsule Take 0.4 mg by mouth in the morning and at bedtime.     ondansetron (ZOFRAN-ODT) 4 MG disintegrating tablet Take 1 tablet (4 mg total) by mouth every 8 (eight) hours as needed for nausea or vomiting. (Patient not taking: Reported on 07/29/2023) 20 tablet 0   No current facility-administered medications for this visit.      PHYSICAL EXAMINATION:   Vitals:   07/29/23 1004  BP: (!) 102/55  Pulse: 74  Resp: 18  SpO2: 97%    Filed Weights   07/29/23 1004  Weight: 157 lb (71.2 kg)      Physical Exam Constitutional:  Comments: Elderly male patient ambulating independently.  Accompanied by his daughter  HENT:     Head: Normocephalic and atraumatic.     Mouth/Throat:     Pharynx: No oropharyngeal exudate.  Eyes:     Pupils: Pupils are equal, round, and reactive to light.  Cardiovascular:     Rate and Rhythm: Normal rate and regular rhythm.  Pulmonary:     Effort: No respiratory distress.     Breath sounds: No wheezing.     Comments: Decreased air entry bilaterally.   Abdominal:     General: Bowel sounds are normal. There is no distension.     Palpations: Abdomen is soft. There is no mass.     Tenderness: There is no abdominal tenderness. There is no guarding or rebound.     Comments: No obvious splenomegaly noted.  Musculoskeletal:        General: No tenderness. Normal range of motion.     Cervical back: Normal range of motion and neck supple.  Skin:    General: Skin is  warm.  Neurological:     Mental Status: He is alert and oriented to person, place, and time.  Psychiatric:        Mood and Affect: Affect normal.     LABORATORY DATA:  I have reviewed the data as listed Lab Results  Component Value Date   WBC 30.4 (H) 07/29/2023   HGB 12.2 (L) 07/29/2023   HCT 37.8 (L) 07/29/2023   MCV 88.7 07/29/2023   PLT 80 (L) 07/29/2023   Recent Labs    05/13/23 0615 06/03/23 1006 07/29/23 0946  NA 137 141 138  K 3.0* 4.0 4.0  CL 104 101 101  CO2 23 28 27   GLUCOSE 106* 99 105*  BUN 39* 28* 27*  CREATININE 1.13 1.01 1.02  CALCIUM 8.4* 8.9 9.3  GFRNONAA >60 >60 >60  PROT 5.7* 5.8* 6.4*  ALBUMIN 2.7* 3.2* 3.9  AST 35 19 19  ALT 20 17 10   ALKPHOS 59 56 79  BILITOT 0.3 0.6 0.6     No results found.    [name] (chronic lymphocytic leukemia) (HCC) #2022- [name] [phenotype based on flow cytometry]- 05/25/2023- PET scan:  No evidence of lymphoma recurrence; Mild splenomegaly not changed from prior. No obvious evidence of progressive disease from underlying [name].   # Today- labs- stable- WBC- 30; Hb 12; platelet- 80; continue monitoring  for now.   #  Mild anemia- Hb-  10- [dec 2024]  ? Etiology-denies any blood in stools.  GFR 58.  However never had colonoscopy. On Vitron C- iron once a day-iron saturation 17%.  Ferritin 34. Improved- Hb 12-  Continue oral iron for now.  # Prostate cancer: [AUG 2022]-Gleason score:12/12 cores were positive for Gleason 4+5/5+4 adenocarcinoma. [not a candidate for definitive therapy surgery or radiation].  Baseline PSA 23.  On  palliative option of ADT q6M.    Tolerating well except for hot flashes.  DEC 2024 PSA - 0.06. Awaiting PSA from today. Stable.   # chronic Hypotension: Stable.   # Hot flashes-grade 1-2 from Eligard-  stable.   # CKD stage II-stable [chronic urinary obstruction-? straight cathing-improved on Eligard]- stable.   # COPD-Active smoker: stable.  Chest x-ray November 2023 no concern for  malignancy.  # Diastolic CHF- [Dr.Paraschoes]- s/p Lasix- improved.   Eligard q6M- # Eligard- third week of March, 2025.  # DISPOSITION:  # Eligard- third week of March, 2025.  # follow up in 3 months- MD;  labs- cbc/cmp; PSA; LDH-Dr.B      All questions were answered. The patient knows to call the clinic with any problems, questions or concerns.   Earna Coder, MD 07/29/2023 11:06 AM

## 2023-07-29 NOTE — Progress Notes (Signed)
Pt's weight is down. He was carrying a lot of fluid which is now gone. Appetite is good. Energy is so so. Has dyspnea with exertion; continues to smoke. Ambulating well. Denies pain.

## 2023-07-29 NOTE — Assessment & Plan Note (Addendum)
#  2022- [name] [phenotype based on flow cytometry]- 05/25/2023- PET scan:  No evidence of lymphoma recurrence; Mild splenomegaly not changed from prior. No obvious evidence of progressive disease from underlying [name].   # Today- labs- stable- WBC- 30; Hb 12; platelet- 80; continue monitoring  for now.   #  Mild anemia- Hb-  10- [dec 2024]  ? Etiology-denies any blood in stools.  GFR 58.  However never had colonoscopy. On Vitron C- iron once a day-iron saturation 17%.  Ferritin 34. Improved- Hb 12-  Continue oral iron for now.  # Prostate cancer: [AUG 2022]-Gleason score:12/12 cores were positive for Gleason 4+5/5+4 adenocarcinoma. [not a candidate for definitive therapy surgery or radiation].  Baseline PSA 23.  On  palliative option of ADT q6M.    Tolerating well except for hot flashes.  DEC 2024 PSA - 0.06. Awaiting PSA from today. Stable.   # chronic Hypotension: Stable.   # Hot flashes-grade 1-2 from Eligard-  stable.   # CKD stage II-stable [chronic urinary obstruction-? straight cathing-improved on Eligard]- stable.   # COPD-Active smoker: stable.  Chest x-ray November 2023 no concern for malignancy.  # Diastolic CHF- [Dr.Paraschoes]- s/p Lasix- improved.   Eligard q6M- # Eligard- third week of March, 2025.  # DISPOSITION:  # Eligard- third week of March, 2025.  # follow up in 3 months- MD; labs- cbc/cmp; PSA; LDH-Dr.B

## 2023-07-30 LAB — PSA: Prostatic Specific Antigen: 0.02 ng/mL (ref 0.00–4.00)

## 2023-08-19 ENCOUNTER — Inpatient Hospital Stay: Payer: Medicare PPO | Attending: Internal Medicine

## 2023-08-19 DIAGNOSIS — C61 Malignant neoplasm of prostate: Secondary | ICD-10-CM | POA: Insufficient documentation

## 2023-08-19 MED ORDER — LEUPROLIDE ACETATE (6 MONTH) 45 MG ~~LOC~~ KIT
45.0000 mg | PACK | Freq: Once | SUBCUTANEOUS | Status: AC
  Administered 2023-08-19: 45 mg via SUBCUTANEOUS
  Filled 2023-08-19: qty 45

## 2023-10-06 ENCOUNTER — Encounter: Payer: Self-pay | Admitting: Family Medicine

## 2023-10-12 ENCOUNTER — Telehealth: Payer: Self-pay | Admitting: *Deleted

## 2023-10-12 NOTE — Telephone Encounter (Signed)
 The daughter says that PCP sent blood work that they had as well as asking the daughter to check with them also. She does not know if it is with an  appt. Or a call to see what needs to be done about the WBC

## 2023-10-12 NOTE — Telephone Encounter (Signed)
 Patient's daughter returned my call.  Discussed results and need to keep next appointment on 10/28/23.  She is agreeable.

## 2023-10-28 ENCOUNTER — Inpatient Hospital Stay: Payer: Medicare PPO | Attending: Internal Medicine

## 2023-10-28 ENCOUNTER — Inpatient Hospital Stay (HOSPITAL_BASED_OUTPATIENT_CLINIC_OR_DEPARTMENT_OTHER): Payer: Medicare PPO | Admitting: Internal Medicine

## 2023-10-28 ENCOUNTER — Encounter: Payer: Self-pay | Admitting: Internal Medicine

## 2023-10-28 VITALS — BP 125/71 | HR 71 | Temp 97.5°F | Resp 18 | Ht 72.0 in | Wt 154.7 lb

## 2023-10-28 DIAGNOSIS — C9110 Chronic lymphocytic leukemia of B-cell type not having achieved remission: Secondary | ICD-10-CM | POA: Diagnosis present

## 2023-10-28 DIAGNOSIS — Z8546 Personal history of malignant neoplasm of prostate: Secondary | ICD-10-CM | POA: Insufficient documentation

## 2023-10-28 LAB — CMP (CANCER CENTER ONLY)
ALT: 11 U/L (ref 0–44)
AST: 21 U/L (ref 15–41)
Albumin: 3.6 g/dL (ref 3.5–5.0)
Alkaline Phosphatase: 76 U/L (ref 38–126)
Anion gap: 6 (ref 5–15)
BUN: 19 mg/dL (ref 8–23)
CO2: 29 mmol/L (ref 22–32)
Calcium: 9 mg/dL (ref 8.9–10.3)
Chloride: 104 mmol/L (ref 98–111)
Creatinine: 0.97 mg/dL (ref 0.61–1.24)
GFR, Estimated: >60 mL/min (ref >60)
Glucose, Bld: 94 mg/dL (ref 70–99)
Potassium: 4.1 mmol/L (ref 3.5–5.1)
Sodium: 139 mmol/L (ref 135–145)
Total Bilirubin: 0.5 mg/dL (ref 0.0–1.2)
Total Protein: 6.4 g/dL — ABNORMAL LOW (ref 6.5–8.1)

## 2023-10-28 LAB — CBC WITH DIFFERENTIAL (CANCER CENTER ONLY)
Abs Immature Granulocytes: 0 10*3/uL (ref 0.00–0.07)
Basophils Absolute: 0 10*3/uL (ref 0.0–0.1)
Basophils Relative: 0 %
Eosinophils Absolute: 0.3 10*3/uL (ref 0.0–0.5)
Eosinophils Relative: 1 %
HCT: 36.4 % — ABNORMAL LOW (ref 39.0–52.0)
Hemoglobin: 11.8 g/dL — ABNORMAL LOW (ref 13.0–17.0)
Lymphocytes Relative: 92 %
Lymphs Abs: 30.1 10*3/uL — ABNORMAL HIGH (ref 0.7–4.0)
MCH: 29.1 pg (ref 26.0–34.0)
MCHC: 32.4 g/dL (ref 30.0–36.0)
MCV: 89.9 fL (ref 80.0–100.0)
Monocytes Absolute: 0.3 10*3/uL (ref 0.1–1.0)
Monocytes Relative: 1 %
Neutro Abs: 2 10*3/uL (ref 1.7–7.7)
Neutrophils Relative %: 6 %
Platelet Count: 72 10*3/uL — ABNORMAL LOW (ref 150–400)
RBC: 4.05 MIL/uL — ABNORMAL LOW (ref 4.22–5.81)
RDW: 15.3 % (ref 11.5–15.5)
Smear Review: NORMAL
WBC Count: 32.7 10*3/uL — ABNORMAL HIGH (ref 4.0–10.5)
nRBC: 0 % (ref 0.0–0.2)

## 2023-10-28 LAB — PSA: Prostatic Specific Antigen: 0.02 ng/mL (ref 0.00–4.00)

## 2023-10-28 LAB — LACTATE DEHYDROGENASE: LDH: 139 U/L (ref 98–192)

## 2023-10-28 NOTE — Assessment & Plan Note (Addendum)
#  2022- [name] [phenotype based on flow cytometry]- 05/25/2023- PET scan:  No evidence of lymphoma recurrence; Mild splenomegaly not changed from prior. No obvious evidence of progressive disease from underlying [name].   # Today- labs- stable- WBC- 30; Hb 11.8; platelet- 70s continue monitoring  for now.- see below.  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   #  Mild anemia- Hb-  10- [dec 2024]  ? Etiology-denies any blood in stools.  GFR 58.  However never had colonoscopy. On Vitron C-PCP- APRIL 2025- iron once a day-iron saturation 17%.  Ferritin 34. Improved- Hb 11.8-  Continue oral iron for now.  # Mild B12 def- 171 [N-180]- with PCP- recommend OTC- B12 1000 mcg/day.   # Prostate cancer: [AUG 2022]-Gleason score:12/12 cores were positive for Gleason 4+5/5+4 adenocarcinoma. [not a candidate for definitive therapy surgery or radiation].  Baseline PSA 23.  On  palliative option of ADT q6M.    Tolerating well except for hot flashes.  DEC 2024 PSA - 0.06. Awaiting PSA from today. Stable.   # chronic Hypotension: Stable.   # Hot flashes-grade 1-2 from Eligard - Stable.   # CKD stage II-stable [chronic urinary obstruction-? straight cathing-improved on Eligard ]-Stable.   # COPD-Active smoker: stable.  Chest x-ray November 2023 no concern for malignancy.  # Diastolic CHF- [Dr.Paraschoes]- s/p Lasix - improved.   Eligard  q6M- # Eligard -March 6th-  2025.  # DISPOSITION:  # follow up in 4 months- MD; labs- cbc/cmp; PSA;B12 LDH- iron studies;ferritin; Eligard - -Dr.B

## 2023-10-28 NOTE — Progress Notes (Signed)
 Daughter states breathing is worse, using inhaler more, but outside doing more now. He has O2 generator at bedtime, not sure of liters.

## 2023-10-28 NOTE — Patient Instructions (Signed)
#   Recommend OTC- B12 1000 mcg/day.

## 2023-10-28 NOTE — Progress Notes (Signed)
 [facility] CONSULT NOTE  Patient Care Team: Sharyne Degree, FNP as PCP - General (Family Medicine) Gwyn Leos, MD as Consulting Physician (Oncology)  CHIEF COMPLAINTS/PURPOSE OF CONSULTATION: [name]/prostate cancer  Oncology History Overview Note  #April-MAY 2022-[name] [phenotype based on flow cytometry]; absolute neutrophil count 7000; white count 12,000 hemoglobin 13; platelets 120s. PET scan- May 2022-no evidence of any lymphadenopathy-suggestive of active [name].Spencer Buck  Borderline enlarged spleen/normal metabolic activity; see discussion regarding left kidney [see below].   # AUG 2022-prostate adenocarcinoma Gleason score 4+5 [12 out of 12 cores]; Dr.Stoioff- SEP 12th, 2022-Eligard  every 6 months  # COPD/active smoker; CAD; hx of urinary obstruction [Dr.Stoioff]; ? Left kidney UPJ obstruction ----------------------------------  HEMATOLOGY HISTORY:  # April 2022- [name] [phenotype flow cytometry]; US -splenomegaly mild.  Absolute neutrophil count 7000; hemoglobin 13; platelets 120s.   # COPD at night O2; active smoker [1-1.5ppd]; CAD/ right bundle branch block, and peripheral vascular disease [Dr.Parashoes]   [name] (chronic lymphocytic leukemia) (HCC)  10/10/2020 Initial Diagnosis   [name] (chronic lymphocytic leukemia) (HCC)   10/22/2020 Cancer Staging   Staging form: Chronic Lymphocytic Leukemia / Small Lymphocytic Lymphoma, AJCC 8th Edition - Clinical: Modified Rai Stage II (Modified Rai risk: Intermediate, Lymphocytosis: Present, Adenopathy: Absent, Organomegaly: Present, Anemia: Absent, Thrombocytopenia: Absent) - Signed by Gwyn Leos, MD on 10/22/2020 Stage prefix: Initial diagnosis     HISTORY OF PRESENTING ILLNESS: Elderly male patient.  Accompanied by his daughter  Spencer Buck 89 y.o.  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  Patient overall is stable.  Chronic mild fatigue.   Patient has chronic dyspnea with exertion; continues to smoke. Ambulating well. Denies pain.  Uses O2 at bedtime may be twice a week.No significant hot flashes.  Denies any swelling of the legs.  Review of Systems  Constitutional:  Positive for malaise/fatigue and weight loss. Negative for chills, diaphoresis and fever.  HENT:  Negative for nosebleeds and sore throat.   Eyes:  Negative for double vision.  Respiratory:  Negative for cough, hemoptysis, sputum production, shortness of breath and wheezing.   Cardiovascular:  Negative for chest pain, palpitations, orthopnea and leg swelling.  Gastrointestinal:  Negative for abdominal pain, blood in stool, constipation, diarrhea, heartburn, melena, nausea and vomiting.  Genitourinary:  Negative for dysuria, frequency and urgency.  Musculoskeletal:  Positive for back pain and joint pain.  Skin: Negative.  Negative for itching and rash.  Neurological:  Negative for dizziness, tingling, focal weakness, weakness and headaches.  Endo/Heme/Allergies:  Does not bruise/bleed easily.  Psychiatric/Behavioral:  Negative for depression. The patient is not nervous/anxious and does not have insomnia.     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)    Nonrheumatic aortic valve disorder    PVD (peripheral vascular disease) (HCC)    Right bundle branch block    Splenomegaly    Urinary retention     SURGICAL HISTORY: Past Surgical History:  Procedure Laterality Date   HERNIA REPAIR Left    KNEE SURGERY Left  torn ligament   PROSTATE BIOPSY N/A  01/28/2021   Procedure: PROSTATE BIOPSY;  Surgeon: Geraline Knapp, MD;  Location: [facility] ORS;  Service: Urology;  Laterality: N/A;   TRANSRECTAL ULTRASOUND N/A 01/28/2021   Procedure: TRANSRECTAL ULTRASOUND;  Surgeon: Geraline Knapp, MD;  Location: [facility] ORS;  Service: Urology;  Laterality: N/A;  Spoke w/[name] in ultrasound confirmed    SOCIAL HISTORY: Social History   Socioeconomic History   Marital status: Married    Spouse name: Not on file   Number of children: Not on file   Years of education: Not on file   Highest education level: Not on file  Occupational History   Not on file  Tobacco Use   Smoking status: Every Day    Current packs/day: 1.50    Average packs/day: 1.5 packs/day for 72.0 years (108.0 ttl pk-yrs)    Types: Cigarettes   Smokeless tobacco: Never  Vaping Use   Vaping status: Never Used  Substance and Sexual Activity   Alcohol use: Yes    Comment: rarely   Drug use: Never   Sexual activity: Yes  Other Topics Concern   Not on file  Social History Narrative   Not on file   Social Drivers of Health   Financial Resource Strain: Not on file  Food Insecurity: Not on file  Transportation Needs: Not on file  Physical Activity: Not on file  Stress: Not on file  Social Connections: Not on file  Intimate Partner Violence: Not on file    FAMILY HISTORY: Family History  Problem Relation Age of Onset   Stroke Paternal Grandmother    Glaucoma Other    Diabetes Other     ALLERGIES:  has no known allergies.  MEDICATIONS:  Current Outpatient Medications  Medication Sig Dispense Refill   albuterol  (ACCUNEB ) 1.25 MG/3ML nebulizer solution Take 1 ampule by nebulization every 6 (six) hours as needed for wheezing.     aspirin 81 MG EC tablet Chew 81 mg by mouth at bedtime.     Budeson-Glycopyrrol-Formoterol (BREZTRI AEROSPHERE) 160-9-4.8 MCG/ACT AERO Inhale into the lungs.     Cholecalciferol (VITAMIN D) 125 MCG (5000 UT) CAPS Take 5,000 Units by mouth daily.      ferrous sulfate 325 (65 FE) MG tablet Take 325 mg by mouth daily with breakfast.     finasteride (PROSCAR) 5 MG tablet Take 5 mg by mouth daily.     hydrochlorothiazide (HYDRODIURIL) 12.5 MG tablet Take 12.5 mg by mouth every other day.     MAGNESIUM GLYCINATE PO Take 500 mg by mouth daily.     OXYGEN Inhale into the lungs. At night     potassium chloride (KLOR-CON) 10 MEQ tablet Take 10 mEq by mouth daily.     tamsulosin (FLOMAX) 0.4 MG CAPS capsule Take 0.4 mg by mouth in the morning and at bedtime.     ondansetron  (ZOFRAN -ODT) 4 MG disintegrating tablet Take 1 tablet (4 mg total) by mouth every 8 (eight) hours as needed for nausea or vomiting. (Patient not taking: Reported on 10/28/2023) 20 tablet 0   No current facility-administered medications for this visit.      PHYSICAL EXAMINATION:   Vitals:   10/28/23 0943  BP: 125/71  Pulse: 71  Resp: 18  Temp: (!) 97.5 F (36.4 C)  SpO2: 100%    Filed Weights   10/28/23 0943  Weight: 154 lb 11.2 oz (70.2 kg)      Physical Exam Constitutional:      Comments:  Elderly male patient ambulating independently.  Accompanied by his daughter  HENT:     Head: Normocephalic and atraumatic.     Mouth/Throat:     Pharynx: No oropharyngeal exudate.  Eyes:     Pupils: Pupils are equal, round, and reactive to light.  Cardiovascular:     Rate and Rhythm: Normal rate and regular rhythm.  Pulmonary:     Effort: No respiratory distress.     Breath sounds: No wheezing.     Comments: Decreased air entry bilaterally.   Abdominal:     General: Bowel sounds are normal. There is no distension.     Palpations: Abdomen is soft. There is no mass.     Tenderness: There is no abdominal tenderness. There is no guarding or rebound.     Comments: No obvious splenomegaly noted.  Musculoskeletal:        General: No tenderness. Normal range of motion.     Cervical back: Normal range of motion and neck supple.  Skin:    General: Skin is warm.   Neurological:     Mental Status: He is alert and oriented to person, place, and time.  Psychiatric:        Mood and Affect: Affect normal.     LABORATORY DATA:  I have reviewed the data as listed Lab Results  Component Value Date   WBC 32.7 (H) 10/28/2023   HGB 11.8 (L) 10/28/2023   HCT 36.4 (L) 10/28/2023   MCV 89.9 10/28/2023   PLT 72 (L) 10/28/2023   Recent Labs    06/03/23 1006 07/29/23 0946 10/28/23 0952  NA 141 138 139  K 4.0 4.0 4.1  CL 101 101 104  CO2 28 27 29   GLUCOSE 99 105* 94  BUN 28* 27* 19  CREATININE 1.01 1.02 0.97  CALCIUM 8.9 9.3 9.0  GFRNONAA >60 >60 >60  PROT 5.8* 6.4* 6.4*  ALBUMIN 3.2* 3.9 3.6  AST 19 19 21   ALT 17 10 11   ALKPHOS 56 79 76  BILITOT 0.6 0.6 0.5     No results found.    [name] (chronic lymphocytic leukemia) (HCC) #2022- [name] [phenotype based on flow cytometry]- 05/25/2023- PET scan:  No evidence of lymphoma recurrence; Mild splenomegaly not changed from prior. No obvious evidence of progressive disease from underlying [name].   # Today- labs- stable- WBC- 30; Hb 11.8; platelet- 70s continue monitoring  for now.- see below.  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   #  Mild anemia- Hb-  10- [dec 2024]  ? Etiology-denies any blood in stools.  GFR 58.  However never had colonoscopy. On Vitron C-PCP- APRIL 2025- iron once a day-iron saturation 17%.  Ferritin 34. Improved- Hb 11.8-  Continue oral iron for now.  # Mild B12 def- 171 [N-180]- with PCP- recommend OTC- B12 1000 mcg/day.   # Prostate cancer: [AUG 2022]-Gleason score:12/12 cores were positive for Gleason 4+5/5+4 adenocarcinoma. [not a candidate for definitive therapy surgery or radiation].  Baseline PSA 23.  On  palliative option of ADT q6M.    Tolerating well except for hot flashes.  DEC 2024 PSA - 0.06. Awaiting PSA from today. Stable.   # chronic Hypotension: Stable.   # Hot flashes-grade 1-2 from Eligard -  Stable.   # CKD stage II-stable [chronic urinary obstruction-? straight cathing-improved on Eligard ]-Stable.   # COPD-Active smoker: stable.  Chest x-ray November 2023 no concern for malignancy.  # Diastolic CHF- [Dr.Paraschoes]- s/p Lasix - improved.  Eligard  q6M- # Eligard -March 6th-  2025.  # DISPOSITION:  # follow up in 4 months- MD; labs- cbc/cmp; PSA;B12 LDH- iron studies;ferritin; Eligard - -Dr.B      All questions were answered. The patient knows to call the clinic with any problems, questions or concerns.   Gwyn Leos, MD 10/28/2023 11:28 AM

## 2023-12-20 ENCOUNTER — Telehealth: Payer: Self-pay | Admitting: *Deleted

## 2023-12-20 ENCOUNTER — Ambulatory Visit: Admitting: Urology

## 2023-12-20 ENCOUNTER — Encounter: Payer: Self-pay | Admitting: Urology

## 2023-12-20 VITALS — BP 108/49 | HR 79 | Ht 72.0 in | Wt 155.0 lb

## 2023-12-20 DIAGNOSIS — R5383 Other fatigue: Secondary | ICD-10-CM

## 2023-12-20 DIAGNOSIS — R339 Retention of urine, unspecified: Secondary | ICD-10-CM

## 2023-12-20 DIAGNOSIS — R399 Unspecified symptoms and signs involving the genitourinary system: Secondary | ICD-10-CM

## 2023-12-20 LAB — URINALYSIS, COMPLETE
Bilirubin, UA: NEGATIVE
Glucose, UA: NEGATIVE
Ketones, UA: NEGATIVE
Nitrite, UA: NEGATIVE
Specific Gravity, UA: 1.025 (ref 1.005–1.030)
Urobilinogen, Ur: 0.2 mg/dL (ref 0.2–1.0)
pH, UA: 6 (ref 5.0–7.5)

## 2023-12-20 LAB — MICROSCOPIC EXAMINATION
Epithelial Cells (non renal): >10 /HPF — AB (ref 0–10)
WBC, UA: >30 /HPF — AB (ref 0–5)

## 2023-12-20 LAB — BLADDER SCAN AMB NON-IMAGING

## 2023-12-20 MED ORDER — CEPHALEXIN 500 MG PO CAPS: 500.0000 mg | ORAL_CAPSULE | Freq: Two times a day (BID) | ORAL | 0 refills | Status: DC

## 2023-12-20 NOTE — Telephone Encounter (Signed)
 I called daughter back to confirm. She was able to get her dad in with Clotilda Cornwall, NP today at 1pm

## 2023-12-20 NOTE — Telephone Encounter (Signed)
 Returned call to daughter who stated patient is having issues with emptying his bladder and having to catheterize himself.  Daughter reports patient has not had to cath self in several months. States has been having increased urgency since July 4th and when he has to cath it usually means he has a UTI.

## 2023-12-20 NOTE — Progress Notes (Signed)
 12/20/2023 2:07 PM   Spencer Buck April 30, 1931 [id number]  Referring provider: Donal Channing SQUIBB, FNP [address] [place],  [place] [address]  Urological history: 1.  Prostate cancer - PSA (10/2023) 0.02 - bx (2022) I PSA 23.2/abnormal DRE - 12/12 cores + Gleason 4+5/5+4 adenocarcinoma - PET (05/2023) - no metastases - Followed by the cancer center  2. Left hydronephrosis - Lasix  renal scan 34% function left kidney; no evidence of obstruction  3. History urinary retention - Previously on intermittent catheterization  Chief Complaint  Patient presents with   Urinary Frequency   HPI: Spencer Buck is a 89 y.o. man who presents today for urinary frequency with his daughter.    Previous records reviewed.   He states for the last 3 weeks he has had to catheterize himself every other day during the night.  He gets about a half a teacup full of urine out with each catheterization.  During the day, he voids without issue.  He has a urinal on the side of his bed and he has noted a slime developing in the bottom of the jug.  Patient denies any modifying or aggravating factors.  Patient denies any recent UTI's, gross hematuria, dysuria or suprapubic/flank pain.  Patient denies any fevers, chills, nausea or vomiting.    UA suspicious for infection  PVR 215 mL   He has also noticed more fatigue and weakness over the last several months.  He is a smoker and consumes alcohol.    Serum creatinine (10/2023) 0.97  PSA (10/2023) 0.02   When he saw Dr. Twylla in March 2024, he was complaining of similar symptoms.  He was on oxybutynin and changed to trospium .   Unfortunately, the trospium  was cost prohibitive.     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)    Nonrheumatic aortic valve disorder    PVD (peripheral vascular disease) (HCC)    Right bundle branch block    Splenomegaly    Urinary retention     Surgical History: Past Surgical History:  Procedure Laterality Date   HERNIA REPAIR Left    KNEE SURGERY Left    torn ligament   PROSTATE BIOPSY N/A 01/28/2021   Procedure: PROSTATE BIOPSY;  Surgeon: [name], MD;  Location: [facility];  Service: Urology;  Laterality: N/A;   TRANSRECTAL ULTRASOUND N/A 01/28/2021   Procedure: TRANSRECTAL ULTRASOUND;  Surgeon: [name], MD;  Location: [facility];  Service: Urology;  Laterality: N/A;  Spoke w/[name] in ultrasound confirmed    Home Medications:  Allergies as of 12/20/2023   No Known Allergies      Medication List        Accurate as of December 20, 2023  2:07 PM. If you have any questions, ask your nurse or doctor.          STOP taking these medications    ondansetron  4 MG disintegrating tablet Commonly known as: ZOFRAN -ODT Stopped by: Toney Difatta       TAKE these medications    albuterol  1.25 MG/3ML nebulizer solution Commonly  known as: ACCUNEB  Take 1 ampule by nebulization every 6 (six) hours as needed for wheezing.   aspirin EC 81 MG tablet Chew 81 mg by mouth at bedtime.   Breztri Aerosphere 160-9-4.8 MCG/ACT Aero inhaler Generic drug: budesonide-glycopyrrolate-formoterol Inhale into the lungs.   cephALEXin  500 MG capsule Commonly known as: KEFLEX  Take 1 capsule (500 mg total) by mouth 2 (two) times daily. Started by: CLOTILDA CORNWALL   ferrous sulfate 325 (65 FE) MG tablet Take 325 mg by mouth daily with breakfast.   finasteride 5 MG tablet Commonly known as: PROSCAR Take 5 mg by mouth daily.   hydrochlorothiazide 12.5 MG tablet Commonly known as: HYDRODIURIL Take 12.5  mg by mouth every other day.   MAGNESIUM GLYCINATE PO Take 500 mg by mouth daily.   OXYGEN Inhale into the lungs. At night   potassium chloride 10 MEQ tablet Commonly known as: KLOR-CON Take 10 mEq by mouth daily.   tamsulosin 0.4 MG Caps capsule Commonly known as: FLOMAX Take 0.4 mg by mouth in the morning and at bedtime.   Vitamin D 125 MCG (5000 UT) Caps Take 5,000 Units by mouth daily.        Allergies: No Known Allergies  Family History: Family History  Problem Relation Age of Onset   Stroke Paternal Grandmother    Glaucoma Other    Diabetes Other     Social History: See HPI for pertinent social history  ROS: Pertinent ROS in HPI  Physical Exam: BP (!) 108/49 (BP Location: Left Arm, Patient Position: Sitting, Cuff Size: Normal)   Pulse 79   Ht 6' (1.829 m)   Wt 155 lb (70.3 kg)   BMI 21.02 kg/m   Constitutional:  Well nourished. Alert and oriented, No acute distress. HEENT: [place] AT, moist mucus membranes.  Trachea midline Cardiovascular: No clubbing, cyanosis, or edema. Respiratory: Normal respiratory effort, no increased work of breathing. Neurologic: Grossly intact, no focal deficits, moving all 4 extremities. Psychiatric: Normal mood and affect.  Laboratory Data: See EPIC and HPI  I have reviewed the labs.   Pertinent Imaging:  12/20/23 13:35  Scan Result    Assessment & Plan:    1. UTI symptoms (Primary) - Urinalysis, suspicious for infection - urine sent for culture - I will start him on Keflex  500 mg twice daily for 7 days empirically and will adjust once urine culture results and sensitivities are available if necessary  2.  Incomplete bladder emptying - Continue tamsulosin 0.4 mg daily - Advised him not to use old catheters, but with the catheters that I provided for him as samples and to use a new one each time he caths himself - Hopefully once we treat the infection, if he has 1, he his need to self catheter will  resolve  3. Fatigue - Advised him that finasteride does have the side effect of fatigue and he may want to stop that for 30 days to see if the fatigue improves and they will go ahead and do this   Return for pending urine culture results .  These notes generated with voice recognition software. I apologize for typographical errors.  CLOTILDA CORNWALL RIGGERS  [facility] [address]  [address] [place], [place] [address] [phone]

## 2023-12-20 NOTE — Telephone Encounter (Signed)
 Pt daughter called back and they were able to get an appt today with urology

## 2023-12-23 ENCOUNTER — Ambulatory Visit: Payer: Self-pay | Admitting: Urology

## 2023-12-23 LAB — CULTURE, URINE COMPREHENSIVE

## 2024-01-23 NOTE — Progress Notes (Deleted)
 01/24/2024 6:14 PM   Spencer Buck [birth year] [id number]  Referring provider: Donal Channing SQUIBB, FNP [address] [place],  [place] [address]  Urological history: 1.  Prostate cancer - PSA (10/2023) 0.02 - bx (2022) I PSA 23.2/abnormal DRE - 12/12 cores + Gleason 4+5/5+4 adenocarcinoma - PET (05/2023) - no metastases - Followed by the cancer center  2. Left hydronephrosis - Lasix  renal scan 34% function left kidney; no evidence of obstruction  3. History urinary retention - Previously on intermittent catheterization  No chief complaint on file.  HPI: Spencer Buck is a 89 y.o. man who presents today for one month follow up with his daughter.    Previous records reviewed.   At his visit on 12/20/2023, he stated for the last 3 weeks he has had to catheterize himself every other day during the night.  He obtained about a half a teacup full of urine out with each catheterization.  During the day, he voids without issue.  He has a urinal on the side of his bed and he has noted a slime developing in the bottom of the jug.  Patient denies any modifying or aggravating factors.  Patient denies any recent UTI's, gross hematuria, dysuria or suprapubic/flank pain.  Patient denies any fevers, chills, nausea or vomiting.   UA suspicious for infection.  Urine culture positive for Aerococcus urinae.  He was prescribed Keflex  which was culture appropriate.  PVR 215 mL   He has also noticed more fatigue and weakness over the last several months.   He is a smoker and consumes alcohol.   Serum creatinine (10/2023) 0.97.   PSA (10/2023) 0.02.   When he saw Dr. Twylla in March 2024, he was complaining of similar symptoms.  He was on oxybutynin and changed to trospium .   Unfortunately, the trospium  was cost prohibitive.   He was advised to continue tamsulosin 0.4 mg daily and stop the finasteride 5 mg daily.   He was given catheter samples for self cath.    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)    Nonrheumatic aortic valve disorder    PVD (peripheral vascular disease) (HCC)    Right bundle branch block    Splenomegaly    Urinary retention     Surgical History: Past Surgical History:  Procedure Laterality Date   HERNIA REPAIR Left    KNEE SURGERY Left    torn ligament   PROSTATE BIOPSY N/A 01/28/2021   Procedure: PROSTATE BIOPSY;  Surgeon: [name], MD;  Location: [facility];  Service: Urology;  Laterality: N/A;   TRANSRECTAL ULTRASOUND N/A 01/28/2021   Procedure: TRANSRECTAL ULTRASOUND;  Surgeon: [name], MD;  Location: [facility];  Service: Urology;  Laterality: N/A;  Spoke w/[name] in ultrasound confirmed    Home Medications:  Allergies as of 01/24/2024   No Known Allergies      Medication List        Accurate as of January 23, 2024  6:14 PM. If you have any questions, ask your nurse or  doctor.          albuterol  1.25 MG/3ML nebulizer solution Commonly known as: ACCUNEB  Take 1 ampule by nebulization every 6 (six) hours as needed for wheezing.   aspirin EC 81 MG tablet Chew 81 mg by mouth at bedtime.   Breztri Aerosphere 160-9-4.8 MCG/ACT Aero inhaler Generic drug: budesonide-glycopyrrolate-formoterol Inhale into the lungs.   cephALEXin  500 MG capsule Commonly known as: KEFLEX  Take 1 capsule (500 mg total) by mouth 2 (two) times daily.   ferrous sulfate 325 (65 FE) MG tablet Take 325 mg by mouth daily with breakfast.   finasteride 5 MG tablet Commonly known as: PROSCAR Take 5 mg by mouth daily.   hydrochlorothiazide 12.5 MG  tablet Commonly known as: HYDRODIURIL Take 12.5 mg by mouth every other day.   MAGNESIUM GLYCINATE PO Take 500 mg by mouth daily.   OXYGEN Inhale into the lungs. At night   potassium chloride 10 MEQ tablet Commonly known as: KLOR-CON Take 10 mEq by mouth daily.   tamsulosin 0.4 MG Caps capsule Commonly known as: FLOMAX Take 0.4 mg by mouth in the morning and at bedtime.   Vitamin D 125 MCG (5000 UT) Caps Take 5,000 Units by mouth daily.        Allergies: No Known Allergies  Family History: Family History  Problem Relation Age of Onset   Stroke Paternal Grandmother    Glaucoma Other    Diabetes Other     Social History: See HPI for pertinent social history  ROS: Pertinent ROS in HPI  Physical Exam: There were no vitals taken for this visit.  Constitutional:  Well nourished. Alert and oriented, No acute distress. HEENT: [place] AT, moist mucus membranes.  Trachea midline, no masses. Cardiovascular: No clubbing, cyanosis, or edema. Respiratory: Normal respiratory effort, no increased work of breathing. GI: Abdomen is soft, non tender, non distended, no abdominal masses. Liver and spleen not palpable.  No hernias appreciated.  Stool sample for occult testing is not indicated.   GU: No CVA tenderness.  No bladder fullness or masses.  Patient with circumcised/uncircumcised phallus. ***Foreskin easily retracted***  Urethral meatus is patent.  No penile discharge. No penile lesions or rashes. Scrotum without lesions, cysts, rashes and/or edema.  Testicles are located scrotally bilaterally. No masses are appreciated in the testicles. Left and right epididymis are normal. Rectal: Patient with  normal sphincter tone. Anus and perineum without scarring or rashes. No rectal masses are appreciated. Prostate is approximately *** grams, *** nodules are appreciated. Seminal vesicles are normal. Skin: No rashes, bruises or suspicious lesions. Lymph: No cervical or inguinal  adenopathy. Neurologic: Grossly intact, no focal deficits, moving all 4 extremities. Psychiatric: Normal mood and affect.   Laboratory Data: See EPIC and HPI  I have reviewed the labs.   Pertinent Imaging: ***  Assessment & Plan:    1. UTI symptoms (Primary) - Urinalysis, suspicious for infection - urine sent for culture - I will start him on Keflex  500 mg twice daily for 7 days empirically and will adjust once urine culture results and sensitivities are available if necessary  2.  Incomplete bladder emptying - Continue tamsulosin 0.4 mg daily - Advised him not to use old catheters, but with the catheters that I provided for him as samples and to use a new one each time he caths himself - Hopefully once we treat the infection, if he has 1, he his need to self catheter will resolve  3. Fatigue - Advised him that finasteride does  have the side effect of fatigue and he may want to stop that for 30 days to see if the fatigue improves and they will go ahead and do this   No follow-ups on file.  These notes generated with voice recognition software. I apologize for typographical errors.  Spencer Buck  [facility] [address]  [address] [place], [place] [address] [phone]

## 2024-01-24 ENCOUNTER — Other Ambulatory Visit: Payer: Self-pay

## 2024-01-24 ENCOUNTER — Ambulatory Visit: Admitting: Urology

## 2024-01-24 DIAGNOSIS — R339 Retention of urine, unspecified: Secondary | ICD-10-CM

## 2024-01-24 DIAGNOSIS — R351 Nocturia: Secondary | ICD-10-CM

## 2024-01-24 DIAGNOSIS — R399 Unspecified symptoms and signs involving the genitourinary system: Secondary | ICD-10-CM

## 2024-01-24 DIAGNOSIS — R5383 Other fatigue: Secondary | ICD-10-CM

## 2024-01-25 ENCOUNTER — Encounter: Payer: Self-pay | Admitting: Urology

## 2024-01-25 ENCOUNTER — Ambulatory Visit: Admitting: Urology

## 2024-01-25 VITALS — BP 117/61 | HR 67 | Ht 72.0 in | Wt 150.0 lb

## 2024-01-25 DIAGNOSIS — R399 Unspecified symptoms and signs involving the genitourinary system: Secondary | ICD-10-CM

## 2024-01-25 DIAGNOSIS — R339 Retention of urine, unspecified: Secondary | ICD-10-CM | POA: Diagnosis not present

## 2024-01-25 LAB — URINALYSIS, COMPLETE
Bilirubin, UA: NEGATIVE
Glucose, UA: NEGATIVE
Ketones, UA: NEGATIVE
Leukocytes,UA: NEGATIVE
Nitrite, UA: NEGATIVE
Protein,UA: NEGATIVE
RBC, UA: NEGATIVE
Specific Gravity, UA: 1.015 (ref 1.005–1.030)
Urobilinogen, Ur: 0.2 mg/dL (ref 0.2–1.0)
pH, UA: 7 (ref 5.0–7.5)

## 2024-01-25 LAB — MICROSCOPIC EXAMINATION: Bacteria, UA: NONE SEEN

## 2024-01-25 LAB — BLADDER SCAN AMB NON-IMAGING

## 2024-01-25 NOTE — Progress Notes (Signed)
 01/25/2024 1:54 PM   Spencer Buck [birth year] [id number]  Referring provider: Donal Channing SQUIBB, FNP [address] [place],  [place] [address]  Urological history: 1.  Prostate cancer - PSA (10/2023) 0.02 - bx (2022) I PSA 23.2/abnormal DRE - 12/12 cores + Gleason 4+5/5+4 adenocarcinoma - PET (05/2023) - no metastases -On palliative ADT through the cancer center - Followed by the cancer center  2. Left hydronephrosis - Lasix  renal scan 34% function left kidney; no evidence of obstruction  3. History urinary retention - Previously on intermittent catheterization  Chief Complaint  Patient presents with   Follow-up   HPI: Spencer Buck is a 89 y.o. man who presents today for one month follow up with his daughter.    Previous records reviewed.   At his visit on 12/20/2023, he stated for the last 3 weeks he has had to catheterize himself every other day during the night.  He obtained about a half a teacup full of urine out with each catheterization.  During the day, he voids without issue.  He has a urinal on the side of his bed and he has noted a slime developing in the bottom of the jug.  Patient denies any modifying or aggravating factors.  Patient denies any recent UTI's, gross hematuria, dysuria or suprapubic/flank pain.  Patient denies any fevers, chills, nausea or vomiting.   UA suspicious for infection.  Urine culture positive for Aerococcus urinae.  He was prescribed Keflex  which was culture appropriate.  PVR 215 mL   He has also noticed more fatigue and weakness over the last several months.   He is a smoker and consumes alcohol.   Serum creatinine (10/2023) 0.97.  PSA (10/2023) 0.02.   When he saw Dr. Twylla in March 2024, he was complaining of similar symptoms.  He was on oxybutynin and changed to trospium .   Unfortunately, the trospium  was cost prohibitive.   He was advised to continue tamsulosin 0.4 mg daily and stop the finasteride 5 mg daily.   He was given  catheter samples for self cath.   His urine culture grew out Aerococcus urinae.    He states he is voiding well now.  He has no need to catheterize himself.  Patient denies any modifying or aggravating factors.  Patient denies any recent UTI's, gross hematuria, dysuria or suprapubic/flank pain.  Patient denies any fevers, chills, nausea or vomiting.    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)    Nonrheumatic aortic valve disorder    PVD (peripheral vascular disease) (HCC)    Right bundle branch block    Splenomegaly    Urinary retention     Surgical History: Past Surgical History:  Procedure Laterality Date   HERNIA REPAIR Left    KNEE SURGERY Left    torn ligament   PROSTATE BIOPSY N/A 01/28/2021   Procedure: PROSTATE BIOPSY;  Surgeon: [name], MD;  Location: [facility];  Service: Urology;  Laterality: N/A;   TRANSRECTAL ULTRASOUND N/A 01/28/2021  Procedure: TRANSRECTAL ULTRASOUND;  Surgeon: [name], MD;  Location: [facility];  Service: Urology;  Laterality: N/A;  Spoke w/[name] in ultrasound confirmed    Home Medications:  Allergies as of 01/25/2024   No Known Allergies      Medication List        Accurate as of January 25, 2024  1:54 PM. If you have any questions, ask your nurse or doctor.          albuterol  1.25 MG/3ML nebulizer solution Commonly known as: ACCUNEB  Take 1 ampule by nebulization every 6 (six) hours as needed for wheezing.   aspirin EC 81 MG tablet Chew 81 mg by mouth at bedtime.   Breztri Aerosphere 160-9-4.8 MCG/ACT  Aero inhaler Generic drug: budesonide-glycopyrrolate-formoterol Inhale into the lungs.   cephALEXin  500 MG capsule Commonly known as: KEFLEX  Take 1 capsule (500 mg total) by mouth 2 (two) times daily.   ferrous sulfate 325 (65 FE) MG tablet Take 325 mg by mouth daily with breakfast.   finasteride 5 MG tablet Commonly known as: PROSCAR Take 5 mg by mouth daily.   hydrochlorothiazide 12.5 MG tablet Commonly known as: HYDRODIURIL Take 12.5 mg by mouth every other day.   MAGNESIUM GLYCINATE PO Take 500 mg by mouth daily.   OXYGEN Inhale into the lungs. At night   potassium chloride 10 MEQ tablet Commonly known as: KLOR-CON Take 10 mEq by mouth daily.   tamsulosin 0.4 MG Caps capsule Commonly known as: FLOMAX Take 0.4 mg by mouth in the morning and at bedtime.   Vitamin D 125 MCG (5000 UT) Caps Take 5,000 Units by mouth daily.        Allergies: No Known Allergies  Family History: Family History  Problem Relation Age of Onset   Stroke Paternal Grandmother    Glaucoma Other    Diabetes Other     Social History: See HPI for pertinent social history  ROS: Pertinent ROS in HPI  Physical Exam: BP 117/61   Pulse 67   Ht 6' (1.829 m)   Wt 150 lb (68 kg)   SpO2 97%   BMI 20.34 kg/m   Constitutional:  Well nourished. Alert and oriented, No acute distress. HEENT: [place] AT, moist mucus membranes.  Trachea midline Cardiovascular: No clubbing, cyanosis, or edema. Respiratory: Normal respiratory effort, no increased work of breathing. Neurologic: Grossly intact, no focal deficits, moving all 4 extremities. Psychiatric: Normal mood and affect.   Laboratory Data: See EPIC and HPI  I have reviewed the labs.   Pertinent Imaging:  01/25/24 13:37  Scan Result    Assessment & Plan:    1. UTI symptoms (Primary) - UA bland - no symptoms today  2.  Incomplete bladder emptying - Continue tamsulosin 0.4 mg daily -He no longer has to use the Foley catheters  he has been voiding well on his own  Return in about 1 year (around 01/24/2025) for pvr and symptom recheck .  These notes generated with voice recognition software. I apologize for typographical errors.  CLOTILDA HELON RIGGERS  [facility] [address]  [address] [place], [place] [address] [phone]

## 2024-02-23 ENCOUNTER — Encounter: Payer: Self-pay | Admitting: Family Medicine

## 2024-02-24 ENCOUNTER — Ambulatory Visit

## 2024-02-24 ENCOUNTER — Other Ambulatory Visit

## 2024-02-24 ENCOUNTER — Ambulatory Visit: Admitting: Internal Medicine

## 2024-02-25 ENCOUNTER — Inpatient Hospital Stay

## 2024-02-25 ENCOUNTER — Encounter: Payer: Self-pay | Admitting: Internal Medicine

## 2024-02-25 ENCOUNTER — Inpatient Hospital Stay (HOSPITAL_BASED_OUTPATIENT_CLINIC_OR_DEPARTMENT_OTHER): Admitting: Internal Medicine

## 2024-02-25 ENCOUNTER — Inpatient Hospital Stay: Attending: Internal Medicine

## 2024-02-25 VITALS — BP 118/62 | HR 64 | Resp 18 | Ht 72.0 in | Wt 155.2 lb

## 2024-02-25 DIAGNOSIS — F17210 Nicotine dependence, cigarettes, uncomplicated: Secondary | ICD-10-CM | POA: Diagnosis not present

## 2024-02-25 DIAGNOSIS — Z79899 Other long term (current) drug therapy: Secondary | ICD-10-CM | POA: Insufficient documentation

## 2024-02-25 DIAGNOSIS — C9110 Chronic lymphocytic leukemia of B-cell type not having achieved remission: Secondary | ICD-10-CM

## 2024-02-25 DIAGNOSIS — C61 Malignant neoplasm of prostate: Secondary | ICD-10-CM | POA: Insufficient documentation

## 2024-02-25 DIAGNOSIS — Z7982 Long term (current) use of aspirin: Secondary | ICD-10-CM | POA: Diagnosis not present

## 2024-02-25 LAB — CBC WITH DIFFERENTIAL (CANCER CENTER ONLY)
Abs Immature Granulocytes: 0.12 K/uL — ABNORMAL HIGH (ref 0.00–0.07)
Basophils Absolute: 0.2 K/uL — ABNORMAL HIGH (ref 0.0–0.1)
Basophils Relative: 0 %
Eosinophils Absolute: 0.4 K/uL (ref 0.0–0.5)
Eosinophils Relative: 1 %
HCT: 36.1 % — ABNORMAL LOW (ref 39.0–52.0)
Hemoglobin: 11.5 g/dL — ABNORMAL LOW (ref 13.0–17.0)
Immature Granulocytes: 0 %
Lymphocytes Relative: 81 %
Lymphs Abs: 39.7 K/uL — ABNORMAL HIGH (ref 0.7–4.0)
MCH: 28.8 pg (ref 26.0–34.0)
MCHC: 31.9 g/dL (ref 30.0–36.0)
MCV: 90.3 fL (ref 80.0–100.0)
Monocytes Absolute: 5.1 K/uL — ABNORMAL HIGH (ref 0.1–1.0)
Monocytes Relative: 11 %
Neutro Abs: 3.2 K/uL (ref 1.7–7.7)
Neutrophils Relative %: 7 %
Platelet Count: 68 K/uL — ABNORMAL LOW (ref 150–400)
RBC: 4 MIL/uL — ABNORMAL LOW (ref 4.22–5.81)
RDW: 14.6 % (ref 11.5–15.5)
Smear Review: NORMAL
WBC Count: 48.7 K/uL — ABNORMAL HIGH (ref 4.0–10.5)
nRBC: 0 % (ref 0.0–0.2)

## 2024-02-25 LAB — CMP (CANCER CENTER ONLY)
ALT: 13 U/L (ref 0–44)
AST: 24 U/L (ref 15–41)
Albumin: 3.9 g/dL (ref 3.5–5.0)
Alkaline Phosphatase: 82 U/L (ref 38–126)
Anion gap: 7 (ref 5–15)
BUN: 40 mg/dL — ABNORMAL HIGH (ref 8–23)
CO2: 27 mmol/L (ref 22–32)
Calcium: 9.5 mg/dL (ref 8.9–10.3)
Chloride: 104 mmol/L (ref 98–111)
Creatinine: 1.12 mg/dL (ref 0.61–1.24)
GFR, Estimated: >60 mL/min (ref >60)
Glucose, Bld: 103 mg/dL — ABNORMAL HIGH (ref 70–99)
Potassium: 4.5 mmol/L (ref 3.5–5.1)
Sodium: 138 mmol/L (ref 135–145)
Total Bilirubin: 0.5 mg/dL (ref 0.0–1.2)
Total Protein: 6.7 g/dL (ref 6.5–8.1)

## 2024-02-25 LAB — VITAMIN B12: Vitamin B-12: 960 pg/mL — ABNORMAL HIGH (ref 180–914)

## 2024-02-25 LAB — IRON AND TIBC
Iron: 52 ug/dL (ref 45–182)
Saturation Ratios: 14 % — ABNORMAL LOW (ref 17.9–39.5)
TIBC: 384 ug/dL (ref 250–450)
UIBC: 332 ug/dL

## 2024-02-25 LAB — FERRITIN: Ferritin: 58 ng/mL (ref 24–336)

## 2024-02-25 LAB — LACTATE DEHYDROGENASE: LDH: 149 U/L (ref 98–192)

## 2024-02-25 LAB — PSA: Prostatic Specific Antigen: <0.02 ng/mL (ref 0.00–4.00)

## 2024-02-25 MED ORDER — LEUPROLIDE ACETATE (6 MONTH) 45 MG ~~LOC~~ KIT
45.0000 mg | PACK | Freq: Once | SUBCUTANEOUS | Status: AC
  Administered 2024-02-25: 45 mg via SUBCUTANEOUS
  Filled 2024-02-25: qty 45

## 2024-02-25 NOTE — Assessment & Plan Note (Addendum)
#  2022- [name] [phenotype based on flow cytometry]- 05/25/2023- PET scan:  No evidence of lymphoma recurrence; Mild splenomegaly not changed from prior. No obvious evidence of progressive disease from underlying [name].   # Today- labs- stable- WBC- 48 Hb 12 ; platelet- 60s continue monitoring  for now.- see below.  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  Will order FISH panel and IV GH testing.  #  Mild anemia- Hb- 10- [dec 2024]  ? Etiology-denies any blood in stools.  GFR 58.  However never had colonoscopy. On Vitron C-PCP- APRIL 2025- iron once a day-iron saturation 17%.  Ferritin 34. Improved- Hb 11.8-  Continue oral iron for now.  # Mild B12 def- 171 [N-180]- with PCP- recommend OTC- B12 1000 mcg/day.   # Prostate cancer: [AUG 2022]-Gleason score:12/12 cores were positive for Gleason 4+5/5+4 adenocarcinoma. [not a candidate for definitive therapy surgery or radiation].  Baseline PSA 23.  On  palliative option of ADT q6M.   Tolerating well except for hot flashes. MAY 2025- PSA 0.02- stable.  # Hot flashes-grade 1-2 from Eligard - Stable.   # CKD stage II-stable [chronic urinary obstruction-? straight cathing-improved on Eligard ]-Stable.   # COPD-Active smoker: stable.  Chest x-ray November 2023 no concern for malignancy- Stable.   # Diastolic CHF- [Dr.Paraschoes]- s/p Lasix -currently worsened-recommend follow-up with cardiology regarding need for continued Lasix .  # ACP: . Would like to discuss and sign DNR.-DNR signed.  Eligard  q6M- # Eligard -Feb 25, 2024-  # DISPOSITION:  #  Eligard  today # follow up in 3 months- MD; labs- cbc/cmp; PSA;B12 LDH- iron studies;ferritin;-Dr.B

## 2024-02-25 NOTE — Progress Notes (Signed)
 [name] CONSULT NOTE  Patient Care Team: Spencer Channing SQUIBB, FNP as PCP - General (Family Medicine) Spencer Spencer SAUNDERS, MD as Consulting Physician (Oncology)  CHIEF COMPLAINTS/PURPOSE OF CONSULTATION: [name]/prostate cancer  Oncology History Overview Note  #April-MAY 2022-[name] [phenotype based on flow cytometry]; absolute neutrophil count 7000; white count 12,000 hemoglobin 13; platelets 120s. PET scan- May 2022-no evidence of any lymphadenopathy-suggestive of active [name].SABRA  Borderline enlarged spleen/normal metabolic activity; see discussion regarding left kidney [see below].   # AUG 2022-prostate adenocarcinoma Gleason score 4+5 [12 out of 12 cores]; Spencer Buck- SEP 12th, 2022-Eligard  every 6 months  # COPD/active smoker; CAD; hx of urinary obstruction [Spencer Buck]; ? Left kidney UPJ obstruction ----------------------------------  HEMATOLOGY HISTORY:  # April 2022- [name] [phenotype flow cytometry]; US -splenomegaly mild.  Absolute neutrophil count 7000; hemoglobin 13; platelets 120s.   # COPD at night O2; active smoker [1-1.5ppd]; CAD/ right bundle branch block, and peripheral vascular disease [Spencer Buck]   [name] (chronic lymphocytic leukemia) (HCC)  10/10/2020 Initial Diagnosis   [name] (chronic lymphocytic leukemia) (HCC)   10/22/2020 Cancer Staging   Staging form: Chronic Lymphocytic Leukemia / Small Lymphocytic Lymphoma, AJCC 8th Edition - Clinical: Modified Rai Stage II (Modified Rai risk: Intermediate, Lymphocytosis: Present, Adenopathy: Absent, Organomegaly: Present, Anemia: Absent, Thrombocytopenia: Absent) - Signed by Spencer Spencer SAUNDERS, MD on 10/22/2020 Stage prefix: Initial diagnosis     HISTORY OF PRESENTING ILLNESS: Elderly male patient.  Accompanied by his daughter  Spencer Buck 89 y.o.  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  Patient denies any new symptoms today.  Chronic  mild fatigue.  Patient has chronic dyspnea with exertion; continues to smoke. Ambulating well. Denies pain.  Noncompliant with oxygen..No significant hot flashes.  No worsening of the swelling.  Patient not sure if he is taking his diuretics.  Review of Systems  Constitutional:  Positive for malaise/fatigue and weight loss. Negative for chills, diaphoresis and fever.  HENT:  Negative for nosebleeds and sore throat.   Eyes:  Negative for double vision.  Respiratory:  Negative for cough, hemoptysis, sputum production, shortness of breath and wheezing.   Cardiovascular:  Negative for chest pain, palpitations, orthopnea and leg swelling.  Gastrointestinal:  Negative for abdominal pain, blood in stool, constipation, diarrhea, heartburn, melena, nausea and vomiting.  Genitourinary:  Negative for dysuria, frequency and urgency.  Musculoskeletal:  Positive for back pain and joint pain.  Skin: Negative.  Negative for itching and rash.  Neurological:  Negative for dizziness, tingling, focal weakness, weakness and headaches.  Endo/Heme/Allergies:  Does not bruise/bleed easily.  Psychiatric/Behavioral:  Negative for depression. The patient is not nervous/anxious and does not have insomnia.     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)    Nonrheumatic aortic valve disorder    PVD (peripheral vascular disease) (HCC)    Right bundle branch block    Splenomegaly    Urinary retention     SURGICAL HISTORY: Past Surgical History:  Procedure Laterality Date   HERNIA REPAIR Left  KNEE SURGERY Left     torn ligament   PROSTATE BIOPSY N/A 01/28/2021   Procedure: PROSTATE BIOPSY;  Surgeon: Spencer Glendia BROCKS, MD;  Location: [facility] ORS;  Service: Urology;  Laterality: N/A;   TRANSRECTAL ULTRASOUND N/A 01/28/2021   Procedure: TRANSRECTAL ULTRASOUND;  Surgeon: Spencer Glendia BROCKS, MD;  Location: [facility] ORS;  Service: Urology;  Laterality: N/A;  Spoke w/Spencer Buck in ultrasound confirmed    SOCIAL HISTORY: Social History   Socioeconomic History   Marital status: Married    Spouse name: Not on file   Number of children: Not on file   Years of education: Not on file   Highest education level: Not on file  Occupational History   Not on file  Tobacco Use   Smoking status: Every Day    Current packs/day: 1.50    Average packs/day: 1.5 packs/day for 72.0 years (108.0 ttl pk-yrs)    Types: Cigarettes   Smokeless tobacco: Never  Vaping Use   Vaping status: Never Used  Substance and Sexual Activity   Alcohol use: Yes    Comment: rarely   Drug use: Never   Sexual activity: Yes  Other Topics Concern   Not on file  Social History Narrative   Not on file   Social Drivers of Health   Financial Resource Strain: Not on file  Food Insecurity: Not on file  Transportation Needs: Not on file  Physical Activity: Not on file  Stress: Not on file  Social Connections: Not on file  Intimate Partner Violence: Not on file    FAMILY HISTORY: Family History  Problem Relation Age of Onset   Stroke Paternal Grandmother    Glaucoma Other    Diabetes Other     ALLERGIES:  has no known allergies.  MEDICATIONS:  Current Outpatient Medications  Medication Sig Dispense Refill   albuterol  (ACCUNEB ) 1.25 MG/3ML nebulizer solution Take 1 ampule by nebulization every 6 (six) hours as needed for wheezing.     aspirin 81 MG EC tablet Chew 81 mg by mouth at bedtime.     Budeson-Glycopyrrol-Formoterol (BREZTRI AEROSPHERE) 160-9-4.8 MCG/ACT AERO Inhale into the lungs.     Cholecalciferol (VITAMIN D) 125 MCG (5000 UT)  CAPS Take 5,000 Units by mouth daily.     ferrous sulfate 325 (65 FE) MG tablet Take 325 mg by mouth daily with breakfast.     finasteride (PROSCAR) 5 MG tablet Take 5 mg by mouth daily.     furosemide  (LASIX ) 40 MG tablet Take 40 mg by mouth daily.     hydrochlorothiazide (HYDRODIURIL) 12.5 MG tablet Take 12.5 mg by mouth every other day.     MAGNESIUM GLYCINATE PO Take 500 mg by mouth daily.     OXYGEN Inhale into the lungs. At night     potassium chloride (KLOR-CON) 10 MEQ tablet Take 10 mEq by mouth daily.     tamsulosin (FLOMAX) 0.4 MG CAPS capsule Take 0.4 mg by mouth in the morning and at bedtime.     No current facility-administered medications for this visit.   Facility-Administered Medications Ordered in Other Visits  Medication Dose Route Frequency Provider Last Rate Last Admin   leuprolide  (6 Month) (ELIGARD ) injection 45 mg  45 mg Subcutaneous Once Dynver Clemson R, MD          PHYSICAL EXAMINATION:   Vitals:   02/25/24 0913  BP: 118/62  Pulse: 64  Resp: 18  SpO2: 100%    Filed Weights   02/25/24 0913  Weight: 155  lb 3.2 oz (70.4 kg)      Physical Exam Constitutional:      Comments: Elderly male patient ambulating independently.  Accompanied by his daughter  HENT:     Head: Normocephalic and atraumatic.     Mouth/Throat:     Pharynx: No oropharyngeal exudate.  Eyes:     Pupils: Pupils are equal, round, and reactive to light.  Cardiovascular:     Rate and Rhythm: Normal rate and regular rhythm.  Pulmonary:     Effort: No respiratory distress.     Breath sounds: No wheezing.     Comments: Decreased air entry bilaterally.   Abdominal:     General: Bowel sounds are normal. There is no distension.     Palpations: Abdomen is soft. There is no mass.     Tenderness: There is no abdominal tenderness. There is no guarding or rebound.     Comments: No obvious splenomegaly noted.  Musculoskeletal:        General: No tenderness. Normal range of motion.      Cervical back: Normal range of motion and neck supple.  Skin:    General: Skin is warm.  Neurological:     Mental Status: He is alert and oriented to person, place, and time.  Psychiatric:        Mood and Affect: Affect normal.     LABORATORY DATA:  I have reviewed the data as listed Lab Results  Component Value Date   WBC 48.7 (H) 02/25/2024   HGB 11.5 (L) 02/25/2024   HCT 36.1 (L) 02/25/2024   MCV 90.3 02/25/2024   PLT 68 (L) 02/25/2024   Recent Labs    07/29/23 0946 10/28/23 0952 02/25/24 0910  NA 138 139 138  K 4.0 4.1 4.5  CL 101 104 104  CO2 27 29 27   GLUCOSE 105* 94 103*  BUN 27* 19 40*  CREATININE 1.02 0.97 1.12  CALCIUM 9.3 9.0 9.5  GFRNONAA >60 >60 >60  PROT 6.4* 6.4* 6.7  ALBUMIN 3.9 3.6 3.9  AST 19 21 24   ALT 10 11 13   ALKPHOS 79 76 82  BILITOT 0.6 0.5 0.5     No results found.    [name] (chronic lymphocytic leukemia) (HCC) #2022- [name] [phenotype based on flow cytometry]- 05/25/2023- PET scan:  No evidence of lymphoma recurrence; Mild splenomegaly not changed from prior. No obvious evidence of progressive disease from underlying [name].   # Today- labs- stable- WBC- 48 Hb 12 ; platelet- 60s continue monitoring  for now.- see below.  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  Will order FISH panel and IV GH testing.  #  Mild anemia- Hb- 10- [dec 2024]  ? Etiology-denies any blood in stools.  GFR 58.  However never had colonoscopy. On Vitron C-PCP- APRIL 2025- iron once a day-iron saturation 17%.  Ferritin 34. Improved- Hb 11.8-  Continue oral iron for now.  # Mild B12 def- 171 [N-180]- with PCP- recommend OTC- B12 1000 mcg/day.   # Prostate cancer: [AUG 2022]-Gleason score:12/12 cores were positive for Gleason 4+5/5+4 adenocarcinoma. [not a candidate for definitive therapy surgery or radiation].  Baseline PSA 23.  On  palliative option of ADT q6M.   Tolerating well except for hot flashes. MAY  2025- PSA 0.02- stable.  # Hot flashes-grade 1-2 from Eligard - Stable.   # CKD stage II-stable [chronic urinary obstruction-? straight cathing-improved on Eligard ]-Stable.   # COPD-Active smoker: stable.  Chest x-ray November  2023 no concern for malignancy- Stable.   # Diastolic CHF- [Dr.Paraschoes]- s/p Lasix -currently worsened-recommend follow-up with cardiology regarding need for continued Lasix .  # ACP: . Would like to discuss and sign DNR.-DNR signed.  Eligard  q6M- # Eligard -Feb 25, 2024-  # DISPOSITION:  #  Eligard  today # follow up in 3 months- MD; labs- cbc/cmp; PSA;B12 LDH- iron studies;ferritin;-Dr.B      All questions were answered. The patient knows to call the clinic with any problems, questions or concerns.   Spencer JONELLE Joe, MD 02/25/2024 10:46 AM

## 2024-02-25 NOTE — Progress Notes (Signed)
 No concerns today. Would like to discuss and sign DNR. Form on counter. Also, given Advanced directives.

## 2024-04-19 ENCOUNTER — Encounter: Payer: Self-pay | Admitting: Internal Medicine

## 2024-05-22 ENCOUNTER — Other Ambulatory Visit: Payer: Self-pay | Admitting: *Deleted

## 2024-05-22 DIAGNOSIS — C61 Malignant neoplasm of prostate: Secondary | ICD-10-CM

## 2024-05-22 DIAGNOSIS — C9110 Chronic lymphocytic leukemia of B-cell type not having achieved remission: Secondary | ICD-10-CM

## 2024-05-23 ENCOUNTER — Encounter: Payer: Self-pay | Admitting: Internal Medicine

## 2024-05-23 ENCOUNTER — Inpatient Hospital Stay: Admitting: Internal Medicine

## 2024-05-23 ENCOUNTER — Inpatient Hospital Stay: Attending: Internal Medicine

## 2024-05-23 VITALS — BP 116/50 | HR 63 | Temp 98.6°F | Resp 20 | Ht 72.0 in | Wt 156.1 lb

## 2024-05-23 DIAGNOSIS — C9110 Chronic lymphocytic leukemia of B-cell type not having achieved remission: Secondary | ICD-10-CM | POA: Diagnosis present

## 2024-05-23 DIAGNOSIS — F17210 Nicotine dependence, cigarettes, uncomplicated: Secondary | ICD-10-CM | POA: Insufficient documentation

## 2024-05-23 DIAGNOSIS — C61 Malignant neoplasm of prostate: Secondary | ICD-10-CM | POA: Diagnosis present

## 2024-05-23 DIAGNOSIS — Z79899 Other long term (current) drug therapy: Secondary | ICD-10-CM | POA: Insufficient documentation

## 2024-05-23 DIAGNOSIS — Z7982 Long term (current) use of aspirin: Secondary | ICD-10-CM | POA: Diagnosis not present

## 2024-05-23 LAB — CMP (CANCER CENTER ONLY)
ALT: 8 U/L (ref 0–44)
AST: 28 U/L (ref 15–41)
Albumin: 3.7 g/dL (ref 3.5–5.0)
Alkaline Phosphatase: 81 U/L (ref 38–126)
Anion gap: 10 (ref 5–15)
BUN: 18 mg/dL (ref 8–23)
CO2: 26 mmol/L (ref 22–32)
Calcium: 9.3 mg/dL (ref 8.9–10.3)
Chloride: 104 mmol/L (ref 98–111)
Creatinine: 1 mg/dL (ref 0.61–1.24)
GFR, Estimated: >60 mL/min (ref >60)
Glucose, Bld: 90 mg/dL (ref 70–99)
Potassium: 4.3 mmol/L (ref 3.5–5.1)
Sodium: 140 mmol/L (ref 135–145)
Total Bilirubin: 0.7 mg/dL (ref 0.0–1.2)
Total Protein: 6 g/dL — ABNORMAL LOW (ref 6.5–8.1)

## 2024-05-23 LAB — CBC WITH DIFFERENTIAL (CANCER CENTER ONLY)
Abs Immature Granulocytes: 0.11 K/uL — ABNORMAL HIGH (ref 0.00–0.07)
Basophils Absolute: 0.1 K/uL (ref 0.0–0.1)
Basophils Relative: 0 %
Eosinophils Absolute: 0.2 K/uL (ref 0.0–0.5)
Eosinophils Relative: 1 %
HCT: 34.9 % — ABNORMAL LOW (ref 39.0–52.0)
Hemoglobin: 11.3 g/dL — ABNORMAL LOW (ref 13.0–17.0)
Immature Granulocytes: 0 %
Lymphocytes Relative: 79 %
Lymphs Abs: 23 K/uL — ABNORMAL HIGH (ref 0.7–4.0)
MCH: 29.3 pg (ref 26.0–34.0)
MCHC: 32.4 g/dL (ref 30.0–36.0)
MCV: 90.4 fL (ref 80.0–100.0)
Monocytes Absolute: 4 K/uL — ABNORMAL HIGH (ref 0.1–1.0)
Monocytes Relative: 14 %
Neutro Abs: 1.7 K/uL (ref 1.7–7.7)
Neutrophils Relative %: 6 %
Platelet Count: 55 K/uL — ABNORMAL LOW (ref 150–400)
RBC: 3.86 MIL/uL — ABNORMAL LOW (ref 4.22–5.81)
RDW: 16.2 % — ABNORMAL HIGH (ref 11.5–15.5)
WBC Count: 29.1 K/uL — ABNORMAL HIGH (ref 4.0–10.5)
nRBC: 0 % (ref 0.0–0.2)

## 2024-05-23 LAB — IRON AND TIBC
Iron: 52 ug/dL (ref 45–182)
Saturation Ratios: 18 % (ref 17.9–39.5)
TIBC: 284 ug/dL (ref 250–450)
UIBC: 232 ug/dL

## 2024-05-23 LAB — PSA: Prostatic Specific Antigen: <0.02 ng/mL (ref 0.00–4.00)

## 2024-05-23 LAB — LACTATE DEHYDROGENASE: LDH: 203 U/L (ref 105–235)

## 2024-05-23 LAB — FERRITIN: Ferritin: 189 ng/mL (ref 24–336)

## 2024-05-23 NOTE — Assessment & Plan Note (Addendum)
#  2022- [name] [phenotype based on flow cytometry]- 05/25/2023- PET scan:  No evidence of lymphoma recurrence; Mild splenomegaly not changed from prior. No obvious evidence of progressive disease from underlying [name].   # Today- labs- stable- WBC- 30-40s Hb 11- 12 ; platelet-50- 60s-given overall stable recommend continue surveillance at this time. however if platelets continue to drop-consider treatment with steroids/rituximab alone.  Pending  FISH panel and IV GH testing.  #  Mild anemia- Hb- 10- [dec 2024]  ? Etiology-denies any blood in stools.  GFR 58.  However never had colonoscopy. On Vitron C-PCP- APRIL 2025- iron once a day-iron saturation 17%.  Ferritin 34. Improved- Hb 11.8-  Continue oral iron for now.  # Mild B12 def- 171 [N-180]- with PCP- recommend OTC- B12 1000 mcg/day.  Okay  # Prostate cancer: [AUG 2022]-Gleason score:12/12 cores were positive for Gleason 4+5/5+4 adenocarcinoma. [not a candidate for definitive therapy surgery or radiation].  Baseline PSA 23.  On  palliative option of ADT q6M.   Tolerating well except for hot flashes. MAY 2025- PSA 0.02- stable.  # Hot flashes-grade 1-2 from Eligard - Stable.   # CKD stage II-stable [chronic urinary obstruction-? straight cathing-improved on Eligard ]-Stable.   # COPD-Active smoker: stable.  Chest x-ray November 2023 no concern for malignancy- Stable.   # Diastolic CHF- [Dr.Paraschoes]- s/p Lasix -currently worsened-recommend follow-up with cardiology regarding need for continued Lasix .  # ACP: . Would like to discuss and sign DNR.-DNR signed.  Eligard  q6M- # Eligard -Feb 25, 2024-  # DISPOSITION:  # follow up in 3 months- MD; labs- cbc/cmp; PSA;B12 LDH- iron studies;ferritin;-Eligard -Dr.B

## 2024-05-23 NOTE — Progress Notes (Signed)
 [facility] CONSULT NOTE  Patient Care Team: Donal Channing SQUIBB, FNP as PCP - General (Family Medicine) Rennie Cindy SAUNDERS, MD as Consulting Physician (Oncology)  CHIEF COMPLAINTS/PURPOSE OF CONSULTATION: [name]/prostate cancer  Oncology History Overview Note  #April-MAY 2022-[name] [phenotype based on flow cytometry]; absolute neutrophil count 7000; white count 12,000 hemoglobin 13; platelets 120s. PET scan- May 2022-no evidence of any lymphadenopathy-suggestive of active [name].SABRA  Borderline enlarged spleen/normal metabolic activity; see discussion regarding left kidney [see below].   # AUG 2022-prostate adenocarcinoma Gleason score 4+5 [12 out of 12 cores]; Dr.Stoioff- SEP 12th, 2022-Eligard  every 6 months  # COPD/active smoker; CAD; hx of urinary obstruction [Dr.Stoioff]; ? Left kidney UPJ obstruction ----------------------------------  HEMATOLOGY HISTORY:  # April 2022- [name] [phenotype flow cytometry]; US -splenomegaly mild.  Absolute neutrophil count 7000; hemoglobin 13; platelets 120s.   # COPD at night O2; active smoker [1-1.5ppd]; CAD/ right bundle branch block, and peripheral vascular disease [Dr.Parashoes]   [name] (chronic lymphocytic leukemia) (HCC)  10/10/2020 Initial Diagnosis   [name] (chronic lymphocytic leukemia) (HCC)   10/22/2020 Cancer Staging   Staging form: Chronic Lymphocytic Leukemia / Small Lymphocytic Lymphoma, AJCC 8th Edition - Clinical: Modified Rai Stage II (Modified Rai risk: Intermediate, Lymphocytosis: Present, Adenopathy: Absent, Organomegaly: Present, Anemia: Absent, Thrombocytopenia: Absent) - Signed by Rennie Cindy SAUNDERS, MD on 10/22/2020 Stage prefix: Initial diagnosis     HISTORY OF PRESENTING ILLNESS: Elderly male patient.  Alone.   Spencer Buck 89 y.o.  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  Discussed the use of AI scribe software for clinical note transcription  with the patient, who gave verbal consent to proceed.  History of Present Illness   Spencer Buck is a 89 year old male with chronic lymphocytic leukemia under surveillance and prostate cancer on androgen deprivation therapy who presents for routine hematology-oncology follow-up.  He remains under surveillance for [name] and reports no new symptoms since his last visit. He denies shortness of breath, lower extremity edema, or changes in his condition.  He continues androgen deprivation therapy with Eligard  for prostate cancer without new genitourinary symptoms or treatment-related issues.  He continues to smoke, though he has reduced his tobacco use.      Review of Systems  Constitutional:  Positive for malaise/fatigue and weight loss. Negative for chills, diaphoresis and fever.  HENT:  Negative for nosebleeds and sore throat.   Eyes:  Negative for double vision.  Respiratory:  Negative for cough, hemoptysis, sputum production, shortness of breath and wheezing.   Cardiovascular:  Negative for chest pain, palpitations, orthopnea and leg swelling.  Gastrointestinal:  Negative for abdominal pain, blood in stool, constipation, diarrhea, heartburn, melena, nausea and vomiting.  Genitourinary:  Negative for dysuria, frequency and urgency.  Musculoskeletal:  Positive for back pain and joint pain.  Skin: Negative.  Negative for itching and rash.  Neurological:  Negative for dizziness, tingling, focal weakness, weakness and headaches.  Endo/Heme/Allergies:  Does not bruise/bleed easily.  Psychiatric/Behavioral:  Negative for depression. The patient is not nervous/anxious and does not have insomnia.     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)    Nonrheumatic aortic valve disorder    PVD (peripheral vascular disease)    Right bundle branch block    Splenomegaly    Urinary retention     SURGICAL HISTORY: Past Surgical History:  Procedure Laterality Date   HERNIA REPAIR Left    KNEE SURGERY Left    torn ligament   PROSTATE BIOPSY N/A 01/28/2021   Procedure: PROSTATE BIOPSY;  Surgeon: [name], MD;  Location: [facility] ORS;  Service: Urology;  Laterality: N/A;   TRANSRECTAL ULTRASOUND N/A 01/28/2021   Procedure: TRANSRECTAL ULTRASOUND;  Surgeon: [name], MD;  Location: [facility] ORS;  Service: Urology;  Laterality: N/A;  Spoke w/[name] in ultrasound confirmed    SOCIAL HISTORY: Social History   Socioeconomic History   Marital status: Married    Spouse name: Not on file   Number of children: Not on file   Years of education: Not on file   Highest education level: Not on file  Occupational History   Not on file  Tobacco Use   Smoking status: Every Day    Current packs/day: 1.50    Average packs/day: 1.5 packs/day for 72.0 years (108.0 ttl pk-yrs)    Types: Cigarettes   Smokeless tobacco: Never  Vaping Use   Vaping status: Never Used  Substance and Sexual Activity   Alcohol use: Yes    Comment: rarely   Drug use: Never   Sexual activity: Yes  Other Topics Concern   Not on file  Social History Narrative   Not on file   Social Drivers of Health   Financial Resource Strain: Not on file  Food Insecurity: Not on file  Transportation Needs: Not on file  Physical Activity: Not on file  Stress: Not on file  Social Connections: Not on file  Intimate Partner Violence: Not on file    FAMILY HISTORY: Family History  Problem Relation Age of Onset   Stroke Paternal Grandmother    Glaucoma Other    Diabetes Other     ALLERGIES:  has no known  allergies.  MEDICATIONS:  Current Outpatient Medications  Medication Sig Dispense Refill   albuterol  (ACCUNEB ) 1.25 MG/3ML nebulizer solution Take 1 ampule by nebulization every 6 (six) hours as needed for wheezing.     aspirin 81 MG EC tablet Chew 81 mg by mouth at bedtime.     Budeson-Glycopyrrol-Formoterol (BREZTRI AEROSPHERE) 160-9-4.8 MCG/ACT AERO Inhale into the lungs.     Cholecalciferol (VITAMIN D) 125 MCG (5000 UT) CAPS Take 5,000 Units by mouth daily.     ferrous sulfate 325 (65 FE) MG tablet Take 325 mg by mouth daily with breakfast.     finasteride (PROSCAR) 5 MG tablet Take 5 mg by mouth daily.     furosemide  (LASIX ) 40 MG tablet Take 40 mg by mouth daily.     hydrochlorothiazide (HYDRODIURIL) 12.5 MG tablet Take 12.5 mg by mouth every other day.     MAGNESIUM GLYCINATE PO Take 500 mg by mouth daily.     OXYGEN Inhale into the lungs. At night     potassium chloride (KLOR-CON) 10 MEQ tablet Take 10 mEq by mouth daily.     tamsulosin (FLOMAX) 0.4 MG CAPS capsule Take 0.4 mg by mouth in the morning and at bedtime.     No current facility-administered medications for this visit.      PHYSICAL EXAMINATION:   Vitals:   05/23/24  0953  BP: (!) 116/50  Pulse: 63  Resp: 20  Temp: 98.6 F (37 C)  SpO2: 100%     Filed Weights   05/23/24 0953  Weight: 156 lb 1.6 oz (70.8 kg)       Physical Exam Constitutional:      Comments: Elderly male patient ambulating independently.  Accompanied by his daughter  HENT:     Head: Normocephalic and atraumatic.     Mouth/Throat:     Pharynx: No oropharyngeal exudate.  Eyes:     Pupils: Pupils are equal, round, and reactive to light.  Cardiovascular:     Rate and Rhythm: Normal rate and regular rhythm.  Pulmonary:     Effort: No respiratory distress.     Breath sounds: No wheezing.     Comments: Decreased air entry bilaterally.   Abdominal:     General: Bowel sounds are normal. There is no distension.     Palpations:  Abdomen is soft. There is no mass.     Tenderness: There is no abdominal tenderness. There is no guarding or rebound.     Comments: No obvious splenomegaly noted.  Musculoskeletal:        General: No tenderness. Normal range of motion.     Cervical back: Normal range of motion and neck supple.  Skin:    General: Skin is warm.  Neurological:     Mental Status: He is alert and oriented to person, place, and time.  Psychiatric:        Mood and Affect: Affect normal.     LABORATORY DATA:  I have reviewed the data as listed Lab Results  Component Value Date   WBC 29.1 (H) 05/23/2024   HGB 11.3 (L) 05/23/2024   HCT 34.9 (L) 05/23/2024   MCV 90.4 05/23/2024   PLT 55 (L) 05/23/2024   Recent Labs    10/28/23 0952 02/25/24 0910 05/23/24 1003  NA 139 138 140  K 4.1 4.5 4.3  CL 104 104 104  CO2 29 27 26   GLUCOSE 94 103* 90  BUN 19 40* 18  CREATININE 0.97 1.12 1.00  CALCIUM 9.0 9.5 9.3  GFRNONAA >60 >60 >60  PROT 6.4* 6.7 6.0*  ALBUMIN 3.6 3.9 3.7  AST 21 24 28   ALT 11 13 8   ALKPHOS 76 82 81  BILITOT 0.5 0.5 0.7     No results found.    [name] (chronic lymphocytic leukemia) (HCC) #2022- [name] [phenotype based on flow cytometry]- 05/25/2023- PET scan:  No evidence of lymphoma recurrence; Mild splenomegaly not changed from prior. No obvious evidence of progressive disease from underlying [name].   # Today- labs- stable- WBC- 30-40s Hb 11- 12 ; platelet-50- 60s-given overall stable recommend continue surveillance at this time. however if platelets continue to drop-consider treatment with steroids/rituximab alone.  Pending  FISH panel and IV GH testing.  #  Mild anemia- Hb- 10- [dec 2024]  ? Etiology-denies any blood in stools.  GFR 58.  However never had colonoscopy. On Vitron C-PCP- APRIL 2025- iron once a day-iron saturation 17%.  Ferritin 34. Improved- Hb 11.8-  Continue oral iron for now.  # Mild B12 def- 171 [N-180]- with PCP- recommend OTC- B12 1000 mcg/day.  Okay  #  Prostate cancer: [AUG 2022]-Gleason score:12/12 cores were positive for Gleason 4+5/5+4 adenocarcinoma. [not a candidate for definitive therapy surgery or radiation].  Baseline PSA 23.  On  palliative option of ADT q6M.   Tolerating well except for hot flashes. MAY 2025- PSA  0.02- stable.  # Hot flashes-grade 1-2 from Eligard - Stable.   # CKD stage II-stable [chronic urinary obstruction-? straight cathing-improved on Eligard ]-Stable.   # COPD-Active smoker: stable.  Chest x-ray November 2023 no concern for malignancy- Stable.   # Diastolic CHF- [Dr.Paraschoes]- s/p Lasix -currently worsened-recommend follow-up with cardiology regarding need for continued Lasix .  # ACP: . Would like to discuss and sign DNR.-DNR signed.  Eligard  q6M- # Eligard -Feb 25, 2024-  # DISPOSITION:  # follow up in 3 months- MD; labs- cbc/cmp; PSA;B12 LDH- iron studies;ferritin;-Eligard -Dr.B  All questions were answered. The patient knows to call the clinic with any problems, questions or concerns.   Cindy JONELLE Joe, MD 05/23/2024 10:45 AM

## 2024-05-23 NOTE — Addendum Note (Signed)
 Addended by: LAEL BROWNING A on: 05/23/2024 10:47 AM   Modules accepted: Orders

## 2024-05-23 NOTE — Progress Notes (Signed)
 Patient states he is having muscle weakness and joint pain.

## 2024-05-29 ENCOUNTER — Encounter: Payer: Self-pay | Admitting: Internal Medicine

## 2024-05-30 LAB — IGVH SOMATIC HYPERMUTATION

## 2024-06-01 LAB — MISC LABCORP TEST (SEND OUT): Labcorp test code: 510340

## 2024-08-21 ENCOUNTER — Inpatient Hospital Stay

## 2024-08-21 ENCOUNTER — Inpatient Hospital Stay: Admitting: Internal Medicine

## 2024-08-22 ENCOUNTER — Inpatient Hospital Stay

## 2024-08-22 ENCOUNTER — Inpatient Hospital Stay: Admitting: Internal Medicine

## 2025-01-24 ENCOUNTER — Ambulatory Visit: Admitting: Urology
